# Patient Record
Sex: Female | Born: 1975 | Hispanic: Yes | Marital: Married | Smoking: Never smoker
Health system: Southern US, Community
[De-identification: ages and names within clinical notes are randomized; demographics above are authoritative.]

## PROBLEM LIST (undated history)

## (undated) DIAGNOSIS — E119 Type 2 diabetes mellitus without complications: Secondary | ICD-10-CM

---

## 2015-09-16 NOTE — L&D Delivery Note (Signed)
Operative Delivery Note At  a viable female was delivered via Kiwi vacuum outlet at 0612 on 08/10/16 .  Presentation: vertex; Position: Right,, Occiput,, Anterior; Station: +5/5. Maternal temp at 0354 of 100.3 and 99.8 at 0420. Pushing since 0400.Marland Kitchen. CLE in place    .  Verbal consent: obtained from patient. Via Optician, dispensingportable translator.  Risks and benefits discussed in detail.  Risks include, but are not limited to the risks of anesthesia, bleeding, infection, damage to maternal tissues, fetal cephalhematoma.  There is also the risk of inability to effect vaginal delivery of the head, or shoulder dystocia that cannot be resolved by established maneuvers, leading to the need for emergency cesarean section.  Placement of Vacuum Kiwi bell without difficulty and with first ctx head was delivered . No pop off . Vacuum removed . Thick meconium noted with delivery of shoulders and body . No nuchal cord . Marland Kitchen. Vigorous female placed on mother's abdomen with nursery staff in attendance . Delayed cord clamping . Repair of small second degree laceration without difficulty after the placenta was removed .   APGAR:8/9 , ; weight  .   Placenta status: , . Meconium stained , intact   Cord:3 v   No complications  Anesthesia:  Cle,  Instruments: kiwi bell Episiotomy:  none Lacerations:  second degree Suture Repair: 2.0 3.0 vicryl Est. Blood Loss (mL):  250 cc  Mom to postpartum.  Baby to Couplet care / Skin to Skin.  SCHERMERHORN,THOMAS 08/10/2016, 6:34 AM

## 2016-01-17 LAB — OB RESULTS CONSOLE HGB/HCT, BLOOD
HEMATOCRIT: 36 %
HEMOGLOBIN: 12.7 g/dL

## 2016-01-17 LAB — OB RESULTS CONSOLE HIV ANTIBODY (ROUTINE TESTING): HIV: NONREACTIVE

## 2016-01-18 LAB — OB RESULTS CONSOLE PLATELET COUNT: Platelets: 321 10*3/uL

## 2016-01-18 LAB — OB RESULTS CONSOLE ABO/RH: RH Type: NEGATIVE

## 2016-01-18 LAB — OB RESULTS CONSOLE HEPATITIS B SURFACE ANTIGEN: Hepatitis B Surface Ag: NEGATIVE

## 2016-01-18 LAB — OB RESULTS CONSOLE RPR: RPR: NONREACTIVE

## 2016-01-23 LAB — OB RESULTS CONSOLE GC/CHLAMYDIA
CHLAMYDIA, DNA PROBE: NEGATIVE
GC PROBE AMP, GENITAL: NEGATIVE

## 2016-01-24 ENCOUNTER — Ambulatory Visit: Payer: Self-pay

## 2016-01-31 ENCOUNTER — Ambulatory Visit
Admission: RE | Admit: 2016-01-31 | Discharge: 2016-01-31 | Disposition: A | Discharge status: Home / Self Care | Payer: Medicaid Other | Source: Ambulatory Visit | Attending: Obstetrics and Gynecology | Admitting: Obstetrics and Gynecology

## 2016-01-31 ENCOUNTER — Other Ambulatory Visit: Payer: Self-pay

## 2016-01-31 VITALS — BP 131/83 | HR 107 | Temp 99.2°F | Ht 61.0 in | Wt 129.0 lb

## 2016-01-31 DIAGNOSIS — O09529 Supervision of elderly multigravida, unspecified trimester: Secondary | ICD-10-CM | POA: Insufficient documentation

## 2016-01-31 DIAGNOSIS — Z36 Encounter for antenatal screening of mother: Secondary | ICD-10-CM | POA: Insufficient documentation

## 2016-01-31 DIAGNOSIS — Z3682 Encounter for antenatal screening for nuchal translucency: Secondary | ICD-10-CM

## 2016-01-31 DIAGNOSIS — O09521 Supervision of elderly multigravida, first trimester: Secondary | ICD-10-CM | POA: Insufficient documentation

## 2016-01-31 NOTE — Progress Notes (Signed)
Referring Provider:   [facility] Department Length of Consultation: 45 minutes  [name] was referred to [facility] for genetic counseling because of advanced maternal age.  The patient will be 40 years old at the time of delivery.  This note summarizes the information we discussed.    We explained that the chance of a chromosome abnormality increases with maternal age.  Chromosomes and examples of chromosome problems were reviewed.  Humans typically have 46 chromosomes in each cell, with half passed through each sperm and egg.  Any change in the number or structure of chromosomes can increase the risk of problems in the physical and mental development of a pregnancy.   Based upon age of the patient, the chance of any chromosome abnormality was 1 in 24. The chance of Down syndrome, the most common chromosome problem associated with maternal age, was 1 in 64.  The risk of chromosome problems is in addition to the 3% general population risk for birth defects and mental retardation.  The greatest chance, of course, is that the baby would be born in good health.  We discussed the following prenatal screening and testing options for this pregnancy:  First trimester screening, which includes nuchal translucency ultrasound screen and first trimester maternal serum marker screening.  The nuchal translucency has approximately an 80% detection rate for Down syndrome and can be positive for other chromosome abnormalities as well as heart defects.  When combined with a maternal serum marker screening, the detection rate is up to 90% for Down syndrome and up to 97% for trisomy 18.     The chorionic villus sampling procedure is available for first trimester chromosome analysis.  This involves the withdrawal of a small amount of chorionic villi (tissue from the developing placenta).  Risk of pregnancy loss is estimated to be approximately 1 in 200 to 1 in 100 (0.5 to 1%).  There  is approximately a 1% (1 in 100) chance that the CVS chromosome results will be unclear.  Chorionic villi cannot be tested for neural tube defects.     Maternal serum marker screening, a blood test that measures pregnancy proteins, can provide risk assessments for Down syndrome, trisomy 18, and open neural tube defects (spina bifida, anencephaly). Because it does not directly examine the fetus, it cannot positively diagnose or rule out these problems.  Targeted ultrasound uses high frequency sound waves to create an image of the developing fetus.  An ultrasound is often recommended as a routine means of evaluating the pregnancy.  It is also used to screen for fetal anatomy problems (for example, a heart defect) that might be suggestive of a chromosomal or other abnormality.   Amniocentesis involves the removal of a small amount of amniotic fluid from the sac surrounding the fetus with the use of a thin needle inserted through the maternal abdomen and uterus.  Ultrasound guidance is used throughout the procedure.  Fetal cells from amniotic fluid are directly evaluated and > 99.5% of chromosome problems and > 98% of open neural tube defects can be detected. This procedure is generally performed after the 15th week of pregnancy.  The main risks to this procedure include complications leading to miscarriage in less than 1 in 200 cases (0.5%).  We also reviewed the availability of cell free fetal DNA testing from maternal blood to determine whether or not the baby may have either Down syndrome, trisomy 18, or trisomy 38.  This test utilizes a maternal blood sample  and DNA sequencing technology to isolate circulating cell free fetal DNA from maternal plasma.  The fetal DNA can then be analyzed for DNA sequences that are derived from the three most common chromosomes involved in aneuploidy, chromosomes 13, 18, and 21.  If the overall amount of DNA is greater than the expected level for any of these chromosomes,  aneuploidy is suspected.  While we do not consider it a replacement for invasive testing and karyotype analysis, a negative result from this testing would be reassuring, though not a guarantee of a normal chromosome complement for the baby.  An abnormal result is certainly suggestive of an abnormal chromosome complement, though we would still recommend CVS or amniocentesis to confirm any findings from this testing.  Cystic Fibrosis and Spinal Muscular Atrophy (SMA) screening were also discussed with the patient. Both conditions are recessive, which means that both parents must be carriers in order to have a child with the disease. Cystic fibrosis (CF) is one of the most common genetic conditions in persons of Caucasian ancestry. This condition occurs in approximately 1 in 2,500 Caucasian persons and results in thickened secretions in the lungs, digestive, and reproductive systems. For a baby to be at risk for having CF, both of the parents must be carriers for this condition. Approximately 1 in 5825 Caucasian persons is a carrier for CF. Current carrier testing looks for the most common mutations in the gene for CF and can detect approximately 90% of carriers in the Caucasian population. This means that the carrier screening can greatly reduce, but cannot eliminate, the chance for an individual to have a child with CF. If an individual is found to be a carrier for CF, then carrier testing would be available for the partner. As part of [place]'s newborn screening profile, all babies born in the state of [place] will have a two-tier screening process. Specimens are first tested to determine the concentration of immunoreactive trypsinogen (IRT). The top 5% of specimens with the highest IRT values then undergo DNA testing using a panel of over 40 common CF mutations. SMA is a neurodegenerative disorder that leads to atrophy of skeletal muscle and overall weakness. This condition is also more prevalent in  the Caucasian population, with 1 in 40-1 in 60 persons being a carrier and 1 in 6,000-1 in 10,000 children being affected. There are multiple forms of the disease, with some causing death in infancy to other forms with survival into adulthood. The genetics of SMA is complex, but carrier screening can detect up to 95% of carriers in the Caucasian population. Similar to CF, a negative result can greatly reduce, but cannot eliminate, the chance to have a child with SMA. The patient declined carrier screening for CF and SMA.  We obtained a detailed family history and pregnancy history.   The family history is unremarkable for birth defects, mental retardation, recurrent pregnancy loss or known chromosome abnormalities.  [name] stated that this is her third pregnancy.  She has two children, ages 4417 and 14 years from a prior relationship.  She reported no complications or exposures in this pregnancy that would be expected to increase the risk for birth defects.  After consideration of the options, [name] elected to proceed with cell free fetal DNA testing and to decline CF and SMA carrier screening.  An ultrasound was performed at the time of the visit.  Fetal anatomy could not be assessed due to early gestational age.  Please refer to  the ultrasound report for details of that study.  We scheduled an appointment for the patient to return at approximately 18 weeks for an anatomy ultrasound.  [name] was encouraged to call with questions or concerns.  We can be contacted at [phone].   Cherly Anderson, MS, CGC

## 2016-01-31 NOTE — Progress Notes (Signed)
Karla Wells, MS, CGC performed an integral service incident to the physician's initial service.  I was physically present in the clinical area and was immediately available to render assistance.   Nashay Brickley C Caral Whan  

## 2016-02-07 LAB — INFORMASEQ(SM) WITH XY ANALYSIS
FETAL FRACTION (%): 12.4
Fetal Number: 1
GESTATIONAL AGE AT COLLECTION: 12.7 wk
Weight: 129 [lb_av]

## 2016-02-14 ENCOUNTER — Telehealth: Payer: Self-pay | Admitting: Obstetrics and Gynecology

## 2016-02-14 NOTE — Telephone Encounter (Signed)
The patient was informed of the results of her recent InformaSeq testing (performed at [facility]) which yielded NEGATIVE results with the aid of a Spanish interpreter.  The patient's specimen showed DNA consistent with two copies of chromosomes 21, 18 and 13.  The sensitivity for trisomy 2621, trisomy 1418 and trisomy 7913 using this testing are reported as 99.1%, 98.3% and 98.1% respectively.  Thus, while the results of this testing are highly accurate, they are not considered diagnostic at this time.  Should more definitive information be desired, the patient may still consider amniocentesis.   As requested to know by the patient, sex chromosome analysis was included for this sample.  Results was consistent with a female (XY) fetus. This is predicted with >97% accuracy.  A maternal serum AFP only should be considered if screening for neural tube defects is desired.  Cherly Andersoneborah F. Larhonda Dettloff, MS, CGC

## 2016-03-13 ENCOUNTER — Ambulatory Visit
Admission: RE | Admit: 2016-03-13 | Discharge: 2016-03-13 | Disposition: A | Discharge status: Home / Self Care | Payer: Medicaid Other | Source: Ambulatory Visit | Attending: Obstetrics and Gynecology | Admitting: Obstetrics and Gynecology

## 2016-03-13 VITALS — BP 122/80 | HR 110 | Temp 98.3°F | Resp 18 | Wt 135.6 lb

## 2016-03-13 DIAGNOSIS — O09521 Supervision of elderly multigravida, first trimester: Secondary | ICD-10-CM

## 2016-03-13 DIAGNOSIS — O09522 Supervision of elderly multigravida, second trimester: Secondary | ICD-10-CM | POA: Insufficient documentation

## 2016-03-13 DIAGNOSIS — Z3A18 18 weeks gestation of pregnancy: Secondary | ICD-10-CM | POA: Insufficient documentation

## 2016-05-23 LAB — OB RESULTS CONSOLE HIV ANTIBODY (ROUTINE TESTING): HIV: NONREACTIVE

## 2016-06-03 ENCOUNTER — Encounter: Payer: Medicaid Other | Attending: Advanced Practice Midwife | Admitting: Dietician

## 2016-06-03 ENCOUNTER — Encounter: Payer: Self-pay | Admitting: Dietician

## 2016-06-03 VITALS — BP 124/88 | Ht 60.0 in | Wt 140.5 lb

## 2016-06-03 DIAGNOSIS — O24419 Gestational diabetes mellitus in pregnancy, unspecified control: Secondary | ICD-10-CM | POA: Insufficient documentation

## 2016-06-03 DIAGNOSIS — O24410 Gestational diabetes mellitus in pregnancy, diet controlled: Secondary | ICD-10-CM

## 2016-06-03 DIAGNOSIS — Z3A00 Weeks of gestation of pregnancy not specified: Secondary | ICD-10-CM | POA: Insufficient documentation

## 2016-06-03 NOTE — Progress Notes (Signed)
Appt. Start Time: 0900 Appt. End Time: 1030  GDM Class 1-Otto Afanador interpreted during visit Diabetes Overview - define DM; state own type of DM; identify functions of pancreas and insulin; define insulin deficiency vs insulin resistance  Psychosocial - identify DM as a source of stress; state the effects of stress on BG control; verbalize appropriate stress management techniques; identify personal stress issues   Nutritional Management - describe effects of food on blood glucose; identify sources of carbohydrate, protein and fat; verbalize the importance of balance meals in controlling blood glucose; identify meals as well balanced or not; estimate servings of carbohydrate from menus; use food labels to identify servings size, content of carbohydrate, fiber, protein, fat, saturated fat and sodium; recognize food sources of fat, saturated fat, trans fat, sodium and verbalize goals for intake; describe healthful appropriate food choices when dining out   Exercise - describe the effects of exercise on blood glucose and importance of regular exercise in controlling diabetes; state a plan for personal exercise; verbalize contraindications for exercise  Medications - state name, dose, timing of currently prescribed medications; describe types of medications available for diabetes   Self-Monitoring - state importance of HBGM and demo procedure accurately; use HBGM results to effectively manage diabetes; identify importance of regular HbA1C testing and goals for results  Acute Complications/Sick Day Guidelines - recognize hyperglycemia and hypoglycemia with causes and effects; identify blood glucose results as high, low or in control; list steps in treating and preventing high and low blood glucose; state appropriate measure to manage blood glucose when ill (need for meds, HBGM plan, when to call physician, need for fluids)   Lifestyle Changes/Goals & Health/Community Resources - state benefits of  making appropriate lifestyle changes; identify habits that need to change (meals, tobacco, alcohol); identify strategies to reduce risk factors for personal health; set goals for proper diabetes care; state need for and frequency of healthcare follow-up; describe appropriate community resources for good health (ADA, web sites, apps)   Pregnancy/Sexual Health - define gestational diabetes; state importance of good blood glucose control and birth control prior to pregnancy; state importance of good blood glucose control in preventing sexual problems (impotence, vaginal dryness, infections, loss of desire); state relationship of blood glucose control and pregnancy outcome; describe risk of maternal and fetal complications  Teaching Materials Used: Meter-True Result meter General Meal Planning Guidelines Daily Food Record Gestational Diabetes Booklet Gestational Video Goals for Healthy Pregnancy

## 2016-06-03 NOTE — Patient Instructions (Addendum)
Read booklet on Gestational Diabetes Follow Gestational Meal Planning Guidelines Avoid fruit juices-continue to drink plenty of water Continue to avoid fried foods and sweets Eat 3 meals/day and 3 snacks as instructed Complete a 3 Day Food Record and bring to next appointment Check blood sugars 4 x day - before breakfast and 2 hrs after every meal and record  Bring blood sugar log to all appointments Walk 30 minutes at least 5 x week if permitted by MD Next appointment   06-12-16 at 1:15p

## 2016-06-12 ENCOUNTER — Encounter: Payer: Medicaid Other | Admitting: Dietician

## 2016-06-12 ENCOUNTER — Encounter: Payer: Self-pay | Admitting: Dietician

## 2016-06-12 VITALS — BP 100/70 | Ht 60.0 in | Wt 141.1 lb

## 2016-06-12 DIAGNOSIS — O24410 Gestational diabetes mellitus in pregnancy, diet controlled: Secondary | ICD-10-CM

## 2016-06-12 NOTE — Patient Instructions (Signed)
   Eat 2-3 servings of carbohydrate foods (carbohidratos) with each meal.   Include a protein food with your evening snack.

## 2016-06-12 NOTE — Progress Notes (Signed)
   Patient's BG record indicates BGs are within goal ranges, except some low post-meal readings initially. She feels the TrueResult meter she was using was giving inaccurate low readings, as she did not have any symptoms even when having a result of 49. She has switched to a different meter, and has not had low readings since. Tested meter with level 1 control (only level available), which tested within range. Advised patient to contact customer service by phone to discuss problem. Informed patient of 10% accuracy range in meters.   Patient's food diary indicates most meals below recommended level of carbohydrate. She is including protein sources with most meals.   Provided 1900kcal meal plan, and wrote individualized menus based on patient's food preferences. Advised adequate carbohydrate intake and protein source with every meal as well as snacks.   Instructed patient on food safety, including avoidance of Listeriosis, and limiting mercury from fish.  Discussed importance of maintaining healthy lifestyle habits to reduce risk of Type 2 DM as well as Gestational DM with any future pregnancies.  Advised patient to use any remaining testing supplies to test some BGs after delivery, and to have BG tested ideally annually, as well as prior to attempting future pregnancies.

## 2016-07-13 LAB — OB RESULTS CONSOLE GBS: STREP GROUP B AG: NEGATIVE

## 2016-07-15 LAB — OB RESULTS CONSOLE GC/CHLAMYDIA
CHLAMYDIA, DNA PROBE: NEGATIVE
GC PROBE AMP, GENITAL: NEGATIVE

## 2016-08-05 ENCOUNTER — Observation Stay
Admission: EM | Admit: 2016-08-05 | Discharge: 2016-08-05 | Disposition: A | Discharge status: Home / Self Care | Payer: Self-pay | Attending: Obstetrics and Gynecology | Admitting: Obstetrics and Gynecology

## 2016-08-05 DIAGNOSIS — Z3A39 39 weeks gestation of pregnancy: Secondary | ICD-10-CM | POA: Insufficient documentation

## 2016-08-05 DIAGNOSIS — O139 Gestational [pregnancy-induced] hypertension without significant proteinuria, unspecified trimester: Secondary | ICD-10-CM | POA: Diagnosis present

## 2016-08-05 DIAGNOSIS — O09522 Supervision of elderly multigravida, second trimester: Secondary | ICD-10-CM

## 2016-08-05 DIAGNOSIS — O133 Gestational [pregnancy-induced] hypertension without significant proteinuria, third trimester: Principal | ICD-10-CM | POA: Insufficient documentation

## 2016-08-05 LAB — COMPREHENSIVE METABOLIC PANEL
ALT: 25 U/L (ref 14–54)
ANION GAP: 10 (ref 5–15)
AST: 26 U/L (ref 15–41)
Albumin: 2.9 g/dL — ABNORMAL LOW (ref 3.5–5.0)
Alkaline Phosphatase: 207 U/L — ABNORMAL HIGH (ref 38–126)
BUN: 11 mg/dL (ref 6–20)
CHLORIDE: 110 mmol/L (ref 101–111)
CO2: 17 mmol/L — ABNORMAL LOW (ref 22–32)
Calcium: 8.9 mg/dL (ref 8.9–10.3)
Creatinine, Ser: 0.53 mg/dL (ref 0.44–1.00)
GFR calc Af Amer: >60 mL/min (ref >60)
Glucose, Bld: 90 mg/dL (ref 65–99)
POTASSIUM: 3.9 mmol/L (ref 3.5–5.1)
Sodium: 137 mmol/L (ref 135–145)
Total Bilirubin: 0.6 mg/dL (ref 0.3–1.2)
Total Protein: 6.5 g/dL (ref 6.5–8.1)

## 2016-08-05 LAB — CBC
HCT: 39.3 % (ref 35.0–47.0)
Hemoglobin: 13.6 g/dL (ref 12.0–16.0)
MCH: 32.5 pg (ref 26.0–34.0)
MCHC: 34.5 g/dL (ref 32.0–36.0)
MCV: 94.2 fL (ref 80.0–100.0)
PLATELETS: 171 10*3/uL (ref 150–440)
RBC: 4.17 MIL/uL (ref 3.80–5.20)
RDW: 14.3 % (ref 11.5–14.5)
WBC: 11.4 10*3/uL — AB (ref 3.6–11.0)

## 2016-08-05 LAB — PROTEIN / CREATININE RATIO, URINE: CREATININE, URINE: 17 mg/dL

## 2016-08-05 NOTE — Progress Notes (Signed)
Patient ID: Karla Powers, female   DOB: 10/31/1975, 40 y.o.   MRN: [id number] Karla Powers November 13, 1975 G3 P2 65w3dpresents for 2evaluation from the [facility] for elevation bp in office  noLOF , no vaginal bleeding ,itial bp 152/92   , later bp 1116/83. 128/88   No vision change , no h/a , no scotomata O;BP 128/88   Pulse 87   Temp 98.2 F (36.8 C) (Oral)   Resp 18   LMP 11/03/2015  ABDsoft NT  CX TFT / 60 / -3 vtx NSTfhr 130 with prolonged accels , reactive , no decels  Labs: neg Protein / cr ration . CBC and met c nl  A: gestational HTN , no evidence of preeclampsia based on labs  P:d/c home with rest  rtc 08/08/16 for BP recheck  Precaution to RTC before then , translator present .

## 2016-08-05 NOTE — OB Triage Note (Signed)
[name] here for Preeclampsia evaluation following elevated BP at [facility]

## 2016-08-05 NOTE — Discharge Summary (Signed)
  Boykin Nearing, MD  Obstetrics    '[]'$ Hide copied text '[]'$ Hover for attribution information Patient ID: Karla Powers, female   DOB: 05-12-1976, 40 y.o.   MRN: [id number] Nianna Igo [birth year] G3 P2 7w3dpresents for 2evaluation from the [facility] for elevation bp in office  noLOF , no vaginal bleeding ,itial bp 152/92   , later bp 1116/83. 128/88   No vision change , no h/a , no scotomata O;BP 128/88   Pulse 87   Temp 98.2 F (36.8 C) (Oral)   Resp 18   LMP 11/03/2015  ABDsoft NT  CX TFT / 60 / -3 vtx NSTfhr 130 with prolonged accels , reactive , no decels  Labs: neg Protein / cr ration . CBC and met c nl  A: gestational HTN , no evidence of preeclampsia based on labs  P:d/c home with rest  rtc 08/08/16 for BP recheck  Precaution to RTC before then , translator present .     Electronically signed by TBoykin Nearing MD at 08/05/2016 10:50 PM      ED to Hosp-Admission (Current) on 08/05/2016        Detailed Report

## 2016-08-05 NOTE — Discharge Instructions (Signed)
Discharge instructions given, all questions answered.

## 2016-08-08 ENCOUNTER — Encounter: Payer: Self-pay | Admitting: *Deleted

## 2016-08-08 ENCOUNTER — Inpatient Hospital Stay
Admission: RE | Admit: 2016-08-08 | Discharge: 2016-08-11 | DRG: 775 | Disposition: A | Discharge status: Home / Self Care | Payer: Medicaid Other | Source: Ambulatory Visit | Attending: Obstetrics & Gynecology | Admitting: Obstetrics & Gynecology

## 2016-08-08 DIAGNOSIS — O24420 Gestational diabetes mellitus in childbirth, diet controlled: Secondary | ICD-10-CM | POA: Diagnosis present

## 2016-08-08 DIAGNOSIS — O134 Gestational [pregnancy-induced] hypertension without significant proteinuria, complicating childbirth: Secondary | ICD-10-CM | POA: Diagnosis present

## 2016-08-08 DIAGNOSIS — O09523 Supervision of elderly multigravida, third trimester: Secondary | ICD-10-CM

## 2016-08-08 DIAGNOSIS — R030 Elevated blood-pressure reading, without diagnosis of hypertension: Secondary | ICD-10-CM | POA: Diagnosis present

## 2016-08-08 DIAGNOSIS — Z3A39 39 weeks gestation of pregnancy: Secondary | ICD-10-CM

## 2016-08-08 DIAGNOSIS — O163 Unspecified maternal hypertension, third trimester: Secondary | ICD-10-CM | POA: Diagnosis present

## 2016-08-08 HISTORY — DX: Type 2 diabetes mellitus without complications: E11.9

## 2016-08-08 LAB — COMPREHENSIVE METABOLIC PANEL
ALBUMIN: 2.9 g/dL — AB (ref 3.5–5.0)
ALT: 28 U/L (ref 14–54)
AST: 30 U/L (ref 15–41)
Alkaline Phosphatase: 194 U/L — ABNORMAL HIGH (ref 38–126)
Anion gap: 10 (ref 5–15)
BUN: 9 mg/dL (ref 6–20)
CHLORIDE: 106 mmol/L (ref 101–111)
CO2: 19 mmol/L — AB (ref 22–32)
CREATININE: 0.57 mg/dL (ref 0.44–1.00)
Calcium: 8.9 mg/dL (ref 8.9–10.3)
GFR calc Af Amer: >60 mL/min (ref >60)
GLUCOSE: 70 mg/dL (ref 65–99)
Potassium: 3.7 mmol/L (ref 3.5–5.1)
SODIUM: 135 mmol/L (ref 135–145)
Total Bilirubin: 0.7 mg/dL (ref 0.3–1.2)
Total Protein: 6.6 g/dL (ref 6.5–8.1)

## 2016-08-08 LAB — CBC
HEMATOCRIT: 38.9 % (ref 35.0–47.0)
HEMOGLOBIN: 13.5 g/dL (ref 12.0–16.0)
MCH: 32.3 pg (ref 26.0–34.0)
MCHC: 34.8 g/dL (ref 32.0–36.0)
MCV: 92.7 fL (ref 80.0–100.0)
Platelets: 180 10*3/uL (ref 150–440)
RBC: 4.2 MIL/uL (ref 3.80–5.20)
RDW: 14.3 % (ref 11.5–14.5)
WBC: 11.1 10*3/uL — AB (ref 3.6–11.0)

## 2016-08-08 LAB — PROTEIN / CREATININE RATIO, URINE
Creatinine, Urine: 37 mg/dL
Total Protein, Urine: <6 mg/dL

## 2016-08-08 LAB — TYPE AND SCREEN
ABO/RH(D): O POS
ANTIBODY SCREEN: NEGATIVE

## 2016-08-08 LAB — GLUCOSE, CAPILLARY: GLUCOSE-CAPILLARY: 70 mg/dL (ref 65–99)

## 2016-08-08 MED ORDER — ACETAMINOPHEN 325 MG PO TABS
650.0000 mg | ORAL_TABLET | ORAL | Status: DC | PRN
Start: 2016-08-08 — End: 2016-08-10

## 2016-08-08 MED ORDER — OXYCODONE-ACETAMINOPHEN 5-325 MG PO TABS: 1.0000 | ORAL_TABLET | ORAL | Status: DC | PRN

## 2016-08-08 MED ORDER — OXYTOCIN 40 UNITS IN LACTATED RINGERS INFUSION - SIMPLE MED: 2.5000 [IU]/h | INTRAVENOUS | Status: DC

## 2016-08-08 MED ORDER — SOD CITRATE-CITRIC ACID 500-334 MG/5ML PO SOLN: 30.0000 mL | ORAL | Status: DC | PRN

## 2016-08-08 MED ORDER — TERBUTALINE SULFATE 1 MG/ML IJ SOLN
0.2500 mg | Freq: Once | INTRAMUSCULAR | Status: DC | PRN
  Filled 2016-08-08: qty 1

## 2016-08-08 MED ORDER — OXYTOCIN BOLUS FROM INFUSION
500.0000 mL | Freq: Once | INTRAVENOUS | Status: AC
  Administered 2016-08-10: 500 mL via INTRAVENOUS

## 2016-08-08 MED ORDER — OXYCODONE-ACETAMINOPHEN 5-325 MG PO TABS: 2.0000 | ORAL_TABLET | ORAL | Status: DC | PRN

## 2016-08-08 MED ORDER — LIDOCAINE HCL (PF) 1 % IJ SOLN
30.0000 mL | INTRAMUSCULAR | Status: DC | PRN
  Administered 2016-08-10: 10 mL via SUBCUTANEOUS

## 2016-08-08 MED ORDER — BUTORPHANOL TARTRATE 1 MG/ML IJ SOLN
1.0000 mg | INTRAMUSCULAR | Status: DC | PRN
Start: 2016-08-08 — End: 2016-08-09

## 2016-08-08 MED ORDER — LACTATED RINGERS IV SOLN
INTRAVENOUS | Status: DC
  Administered 2016-08-08: 21:00:00 via INTRAVENOUS
  Administered 2016-08-08: 1000 mL via INTRAVENOUS
  Administered 2016-08-09 (×3): via INTRAVENOUS

## 2016-08-08 MED ORDER — AMMONIA AROMATIC IN INHA
RESPIRATORY_TRACT | Status: AC
  Filled 2016-08-08: qty 10

## 2016-08-08 MED ORDER — MISOPROSTOL 25 MCG QUARTER TABLET
25.0000 ug | ORAL_TABLET | ORAL | Status: DC | PRN
  Administered 2016-08-08: 25 ug via VAGINAL
  Filled 2016-08-08: qty 1

## 2016-08-08 MED ORDER — OXYTOCIN 40 UNITS IN LACTATED RINGERS INFUSION - SIMPLE MED: 1.0000 m[IU]/min | INTRAVENOUS | Status: DC

## 2016-08-08 MED ORDER — OXYTOCIN 10 UNIT/ML IJ SOLN
INTRAMUSCULAR | Status: AC
  Filled 2016-08-08: qty 2

## 2016-08-08 MED ORDER — MISOPROSTOL 200 MCG PO TABS
ORAL_TABLET | ORAL | Status: AC
  Filled 2016-08-08: qty 4

## 2016-08-08 MED ORDER — OXYTOCIN 40 UNITS IN LACTATED RINGERS INFUSION - SIMPLE MED
INTRAVENOUS | Status: AC
  Administered 2016-08-09: 1 m[IU]/min via INTRAVENOUS
  Filled 2016-08-08: qty 1000

## 2016-08-08 MED ORDER — LACTATED RINGERS IV SOLN
500.0000 mL | INTRAVENOUS | Status: DC | PRN
  Administered 2016-08-09 (×2): 500 mL via INTRAVENOUS

## 2016-08-08 MED ORDER — LIDOCAINE HCL (PF) 1 % IJ SOLN
INTRAMUSCULAR | Status: AC
  Administered 2016-08-10: 10 mL via SUBCUTANEOUS
  Filled 2016-08-08: qty 30

## 2016-08-08 MED ORDER — MISOPROSTOL 25 MCG QUARTER TABLET
25.0000 ug | ORAL_TABLET | ORAL | Status: DC
  Administered 2016-08-08 – 2016-08-09 (×3): 25 ug via ORAL
  Filled 2016-08-08: qty 0.25
  Filled 2016-08-08: qty 1
  Filled 2016-08-08: qty 0.25

## 2016-08-08 MED ORDER — ONDANSETRON HCL 4 MG/2ML IJ SOLN: 4.0000 mg | Freq: Four times a day (QID) | INTRAMUSCULAR | Status: DC | PRN

## 2016-08-08 NOTE — H&P (Signed)
OB History & Physical   History of Present Illness:  Chief Complaint:   HPI:  Karla Powers is a 40 y.o. G3P2 female at 6067w6d dated by LMP c/w 13wk US with edc of 08/09/16.  She presents to L&D for BP recheck due to elevated pressures on 11/21, and need for monitoring.  +FM, no CTX, no LOF, no VB Denies: HA, visual changes, SOB, or RUQ/epigastric pain   Pregnancy Issues: 1. Gestational diabetes A1 2. Gestational hypertension, dx'd today  Spanish speaking only, needs interpreter.  Maternal Medical History:   Past Medical History:  Diagnosis Date  . Diabetes mellitus without complication (HCC)    with this pregnancy, diet control only    History reviewed. No pertinent surgical history.  No Known Allergies  Prior to Admission medications   Medication Sig Start Date End Date Taking? Authorizing Provider  Prenatal Vit-Fe Fumarate-FA (MULTIVITAMIN-PRENATAL) 27-0.8 MG TABS tablet Take 1 tablet by mouth daily at 12 noon.   Yes Historical Provider, MD     Pren[facility] care site[facility]   Social History: She  reports that she has never smoked. She has never used smokeless tobacco. She reports that she does not drink alcohol or use drugs.  Family History: family history is not on file.   Review of Systems: A full review of systems was performed and negative except as noted in the HPI.     Physical Exam:  Vital Signs: BP 132/89   Pulse 73   Temp 98 F (36.7 C) (Oral)   Resp 20   Ht 5\' 1"  (1.549 m)   Wt 66.7 kg (147 lb)   LMP 11/03/2015   BMI 27.78 kg/m  General: no acute distress.  HEENT: normocephalic, atraumatic Heart: regular rate & rhythm.  No murmurs/rubs/gallops Lungs: clear to auscultation bilaterally, normal respiratory effort Abdomen: soft, gravid, non-tender;  EFW: 8.2 Pelvic:   External: Normal external female genitalia  Cervix: Dilation: Fingertip / Effacement (%): 50 / Station: Ballotable    Extremities: non-tender,  symmetric, 1+ edema bilaterally.  DTRs: 3+ Neurologic: Alert & oriented x 3.    No results found for this or any previous visit (from the past 24 hour(s)).  Pertinent Results:  Prenatal Labs: Blood type/Rh O+  Antibody screen neg  Rubella Immune  Varicella Immune  RPR NR  HBsAg Neg  HIV NR  GC neg  Chlamydia neg  Genetic screening negative  1 hour GTT 159  3 hour GTT 91/ 188 / 166 / 140   GBS negative   FHT:  145 mod + accels no decels TOCO: irregular SVE:  Dilation: Fingertip / Effacement (%): 50 / Station: Ballotable    Cephalic by [employer]  Assessment:  Karla Powers is a 40 y.o. G3P2 female at 2067w6d with IOL for GHTN.   Plan:  1. Admit to Labor & Delivery 2. CBC, T&S, Clrs, IVF 3. GBS  neg 4. Consents obtained. 5. Continuous efm/toco 6. IUP: category 1 7. IOL:  Bishop <6, will start with buccal cytotec.  Will consider adding Foley Bulb once further dilated.   8. GDM: check BS on admission, if elevated, will need consistent monitoring throughout labor.  If not, will check intermittently PRN.  ----- Ranae Plumberhelsea Delshawn Stech, MD Attending Obstetrician and Gynecologist [facility], Department of OB/GYN [facility]

## 2016-08-08 NOTE — Progress Notes (Signed)
Pt transferred to LDR # 3  by A Martie RoundMilner RN, accompanied by daughter and Celine Ahrunt and belongings

## 2016-08-08 NOTE — OB Triage Note (Addendum)
Pt states she was seen in [facility] on Tues. BP noted to elevated, [facility] Seen by Dr schermerhorn, and evaluated. Pt states she had P checked and labs and urine sent and was discharged home. Pt states DR instructed her to return today for BP recheck as next HD appointment on the 29th too far out.Triage admission aided by [facility] tranlator, Myriam JacobsonHelen. Pt accompanied by 40 year old daughter and her mother, all Spanish speaking

## 2016-08-09 ENCOUNTER — Inpatient Hospital Stay: Payer: Medicaid Other | Admitting: Anesthesiology

## 2016-08-09 LAB — GLUCOSE, CAPILLARY: GLUCOSE-CAPILLARY: 70 mg/dL (ref 65–99)

## 2016-08-09 LAB — RPR: RPR: NONREACTIVE

## 2016-08-09 MED ORDER — BUTORPHANOL TARTRATE 1 MG/ML IJ SOLN
2.0000 mg | INTRAMUSCULAR | Status: DC | PRN
Start: 2016-08-09 — End: 2016-08-10

## 2016-08-09 MED ORDER — SODIUM CHLORIDE FLUSH 0.9 % IV SOLN
INTRAVENOUS | Status: AC
  Filled 2016-08-09: qty 10

## 2016-08-09 MED ORDER — FENTANYL 2.5 MCG/ML W/ROPIVACAINE 0.2% IN NS 100 ML EPIDURAL INFUSION (ARMC-ANES)
EPIDURAL | Status: DC | PRN
  Administered 2016-08-09: 10 mL/h via EPIDURAL
  Administered 2016-08-10: 250 ug via EPIDURAL

## 2016-08-09 MED ORDER — FENTANYL 2.5 MCG/ML W/ROPIVACAINE 0.2% IN NS 100 ML EPIDURAL INFUSION (ARMC-ANES)
EPIDURAL | Status: AC
  Filled 2016-08-09: qty 100

## 2016-08-09 MED ORDER — OXYTOCIN 40 UNITS IN LACTATED RINGERS INFUSION - SIMPLE MED
1.0000 m[IU]/min | INTRAVENOUS | Status: DC
  Administered 2016-08-09: 1 m[IU]/min via INTRAVENOUS
  Administered 2016-08-09: 7 m[IU]/min via INTRAVENOUS

## 2016-08-09 MED ORDER — SODIUM CHLORIDE 0.9 % IV SOLN
INTRAVENOUS | Status: DC | PRN
  Administered 2016-08-09 (×3): 5 mL via EPIDURAL

## 2016-08-09 MED ORDER — TERBUTALINE SULFATE 1 MG/ML IJ SOLN
0.2500 mg | Freq: Once | INTRAMUSCULAR | Status: AC | PRN
  Administered 2016-08-09: 0.25 mg via SUBCUTANEOUS

## 2016-08-09 MED ORDER — LIDOCAINE-EPINEPHRINE (PF) 1.5 %-1:200000 IJ SOLN
INTRAMUSCULAR | Status: DC | PRN
  Administered 2016-08-09: 3 mL

## 2016-08-09 NOTE — Anesthesia Preprocedure Evaluation (Signed)
Anesthesia Evaluation  Patient identified by MRN, date of birth, ID band Patient awake    Reviewed: Allergy & Precautions, NPO status , Patient's Chart, lab work & pertinent test results  History of Anesthesia Complications Negative for: history of anesthetic complications  Airway Mallampati: II  TM Distance: >3 FB Neck ROM: Full    Dental no notable dental hx.    Pulmonary neg pulmonary ROS, neg sleep apnea, neg COPD,    breath sounds clear to auscultation- rhonchi (-) wheezing      Cardiovascular hypertension (gHTN), (-) CAD and (-) Past MI  Rhythm:Regular Rate:Normal - Systolic murmurs, - Diastolic murmurs and - Carotid Bruit    Neuro/Psych negative neurological ROS  negative psych ROS   GI/Hepatic negative GI ROS, Neg liver ROS,   Endo/Other  diabetes (gDM)  Renal/GU negative Renal ROS     Musculoskeletal negative musculoskeletal ROS (+)   Abdominal Gravid abdomen  Peds  Hematology negative hematology ROS (+)   Anesthesia Other Findings   Reproductive/Obstetrics (+) Pregnancy                             Anesthesia Physical Anesthesia Plan  ASA: II  Anesthesia Plan: Epidural   Post-op Pain Management:    Induction:   Airway Management Planned:   Additional Equipment:   Intra-op Plan:   Post-operative Plan:   Informed Consent: I have reviewed the patients History and Physical, chart, labs and discussed the procedure including the risks, benefits and alternatives for the proposed anesthesia with the patient or authorized representative who has indicated his/her understanding and acceptance.     Plan Discussed with: Anesthesiologist  Anesthesia Plan Comments:         Lab Results  Component Value Date   WBC 11.1 (H) 08/08/2016   HGB 13.5 08/08/2016   HCT 38.9 08/08/2016   MCV 92.7 08/08/2016   PLT 180 08/08/2016    Anesthesia Quick Evaluation

## 2016-08-09 NOTE — Progress Notes (Signed)
Patient ID: Karla Powers, female   DOB: [birth year], 40 y.o.   MRN: [id number] arom , clear  IUPC placed

## 2016-08-09 NOTE — Anesthesia Procedure Notes (Signed)
Epidural Patient location during procedure: OB Start time: 08/09/2016 6:30 PM End time: 08/09/2016 6:49 PM  Staffing Anesthesiologist: Karla Powers, Karla Powers Performed: anesthesiologist   Preanesthetic Checklist Completed: patient identified, site marked, surgical consent, pre-op evaluation, timeout performed, IV checked, risks and benefits discussed and monitors and equipment checked  Epidural Patient position: sitting Prep: ChloraPrep Patient monitoring: heart rate, continuous pulse ox and blood pressure Approach: midline Location: L4-L5 Injection technique: LOR saline  Needle:  Needle type: Tuohy  Needle gauge: 18 G Needle length: 9 cm and 9 Needle insertion depth: 5.5 cm Catheter type: closed end flexible Catheter size: 20 Guage Catheter at skin depth: 9.5 cm Test dose: negative (0.125% bupivacaine)  Assessment Events: blood not aspirated, injection not painful, no injection resistance, negative IV test and no paresthesia  Additional Notes   Patient tolerated the insertion well without complications.Reason for block:procedure for pain

## 2016-08-09 NOTE — Progress Notes (Signed)
Steva ColderMiriam L Osorio Lainez is a 40 y.o. G3P2 at 5465w0d currently on 9 mu/min  Subjective:   Objective: BP 118/81   Pulse 80   Temp 98.7 F (37.1 C) (Oral)   Resp 16   Ht 5\' 1"  (1.549 m)   Wt 147 lb (66.7 kg)   LMP 11/03/2015   BMI 27.78 kg/m  I/O last 3 completed shifts: In: 2175 [I.V.:2175] Out: -  Total I/O In: 67.5 [I.V.:67.5] Out: -   FHT:  FHR: 140 bpm, variability: minimal ,  accelerations:  Present,  decelerations:  Absent UC:   regular, every 3 minutes SVE:   Dilation: 3.5 Effacement (%): 80 Station: -2 Exam by:: dr schermerhorn  Labs: Lab Results  Component Value Date   WBC 11.1 (H) 08/08/2016   HGB 13.5 08/08/2016   HCT 38.9 08/08/2016   MCV 92.7 08/08/2016   PLT 180 08/08/2016    Assessment / Plan:  Plan on  AROM  And place IUPC and cont Pitocin  SCHERMERHORN,THOMAS 08/09/2016, 5:51 PM

## 2016-08-09 NOTE — Progress Notes (Signed)
Patient ID: Karla Powers, female   DOB: Jan 21, 1976, 40 y.o.   MRN: [id number] 7 min spontaneous decel responded to Pit off , O2 and IVF + sq terb .  Possible hyperstimulation  Vs relative hypotension post the CLE  BP 98/ 60  Cont observation  Explained the issues via the translator

## 2016-08-09 NOTE — Progress Notes (Signed)
Karla Powers is a 40 y.o. G3P2 at 5279w0d induction day 2 for Gestational HTN . cytotec   Oral 25 mcg q 4 hrs last at 0500. Irregular ctx  Objective: BP 124/72 (BP Location: Right Arm)   Pulse 70   Temp 98.1 F (36.7 C) (Oral)   Resp 16   Ht 5\' 1"  (1.549 m)   Wt 147 lb (66.7 kg)   LMP 11/03/2015   BMI 27.78 kg/m  I/O last 3 completed shifts: In: 2175 [I.V.:2175] Out: -  No intake/output data recorded.  FHT:  FHR: 150 bpm, variability: moderate,  accelerations:  Present,  decelerations:  Absent UC:   irregular, every 4-6 minutes SVE:   Dilation: Fingertip Effacement (%): 50 Station: Ballotable Exam by:: Orie FishermanM Taylor RN Exam at [phone] TJS 2 cm / 80 /0  vtx Labs: Lab Results  Component Value Date   WBC 11.1 (H) 08/08/2016   HGB 13.5 08/08/2016   HCT 38.9 08/08/2016   MCV 92.7 08/08/2016   PLT 180 08/08/2016    Assessment / Plan:inductional of labor  Induction of labor for gestational HTN  BP stable  Will start pitocin at noon  Stadol / cle prn pain    Adaliah Hiegel 08/09/2016, 8:53 AM

## 2016-08-09 NOTE — Progress Notes (Signed)
Karla Powers is a 40 y.o. G3P2 at 6456w0d Subjective: Comfortable   a few episodes of fetal decels that prompted the pitocin to be turned off.  Objective: BP 122/79   Pulse (!) 104   Temp 98.7 F (37.1 C) (Oral)   Resp 16   Ht 5\' 1"  (1.549 m)   Wt 147 lb (66.7 kg)   LMP 11/03/2015   SpO2 100%   BMI 27.78 kg/m  I/O last 3 completed shifts: In: 4102.5 [P.O.:360; I.V.:3742.5] Out: -  Total I/O In: 621.5 [P.O.:120; I.V.:501.5] Out: -   FHT:  FHR: 150 bpm, variability: moderate,  accelerations:  Present,  decelerations:  Absent UC:   regular, every 6-7 minutes SVE:   5.5 cm / c/ -2  Labs: Lab Results  Component Value Date   WBC 11.1 (H) 08/08/2016   HGB 13.5 08/08/2016   HCT 38.9 08/08/2016   MCV 92.7 08/08/2016   PLT 180 08/08/2016    Assessment / Plan: Protracted active phase with inadequate ctx pattern  Restart pitocin slowly  Cont to monitor Karla Powers 08/09/2016, 11:31 PM

## 2016-08-10 MED ORDER — COCONUT OIL OIL
1.0000 "application " | TOPICAL_OIL | Status: DC | PRN
  Filled 2016-08-10: qty 120

## 2016-08-10 MED ORDER — MAGNESIUM HYDROXIDE 400 MG/5ML PO SUSP: 30.0000 mL | ORAL | Status: DC | PRN

## 2016-08-10 MED ORDER — SIMETHICONE 80 MG PO CHEW: 80.0000 mg | CHEWABLE_TABLET | ORAL | Status: DC | PRN

## 2016-08-10 MED ORDER — DIBUCAINE 1 % RE OINT: 1.0000 "application " | TOPICAL_OINTMENT | RECTAL | Status: DC | PRN

## 2016-08-10 MED ORDER — FENTANYL 2.5 MCG/ML W/ROPIVACAINE 0.2% IN NS 100 ML EPIDURAL INFUSION (ARMC-ANES)
EPIDURAL | Status: AC
  Filled 2016-08-10: qty 100

## 2016-08-10 MED ORDER — WITCH HAZEL-GLYCERIN EX PADS: 1.0000 "application " | MEDICATED_PAD | CUTANEOUS | Status: DC | PRN

## 2016-08-10 MED ORDER — MEASLES, MUMPS & RUBELLA VAC ~~LOC~~ INJ
0.5000 mL | INJECTION | Freq: Once | SUBCUTANEOUS | Status: DC
  Filled 2016-08-10: qty 0.5

## 2016-08-10 MED ORDER — PRENATAL MULTIVITAMIN CH
1.0000 | ORAL_TABLET | Freq: Every day | ORAL | Status: DC
  Administered 2016-08-10 – 2016-08-11 (×2): 1 via ORAL
  Filled 2016-08-10 (×2): qty 1

## 2016-08-10 MED ORDER — ONDANSETRON HCL 4 MG PO TABS: 4.0000 mg | ORAL_TABLET | ORAL | Status: DC | PRN

## 2016-08-10 MED ORDER — DIPHENHYDRAMINE HCL 25 MG PO CAPS: 25.0000 mg | ORAL_CAPSULE | Freq: Four times a day (QID) | ORAL | Status: DC | PRN

## 2016-08-10 MED ORDER — ZOLPIDEM TARTRATE 5 MG PO TABS: 5.0000 mg | ORAL_TABLET | Freq: Every evening | ORAL | Status: DC | PRN

## 2016-08-10 MED ORDER — ONDANSETRON HCL 4 MG/2ML IJ SOLN: 4.0000 mg | INTRAMUSCULAR | Status: DC | PRN

## 2016-08-10 MED ORDER — ACETAMINOPHEN 325 MG PO TABS: 650.0000 mg | ORAL_TABLET | ORAL | Status: DC | PRN

## 2016-08-10 MED ORDER — SENNOSIDES-DOCUSATE SODIUM 8.6-50 MG PO TABS: 2.0000 | ORAL_TABLET | ORAL | Status: DC

## 2016-08-10 MED ORDER — BENZOCAINE-MENTHOL 20-0.5 % EX AERO: 1.0000 "application " | INHALATION_SPRAY | CUTANEOUS | Status: DC | PRN

## 2016-08-10 MED ORDER — IBUPROFEN 600 MG PO TABS
600.0000 mg | ORAL_TABLET | Freq: Four times a day (QID) | ORAL | Status: DC
  Administered 2016-08-10 – 2016-08-11 (×5): 600 mg via ORAL
  Filled 2016-08-10 (×5): qty 1

## 2016-08-10 MED ORDER — HYDROCODONE-ACETAMINOPHEN 5-325 MG PO TABS
1.0000 | ORAL_TABLET | ORAL | Status: DC | PRN
  Administered 2016-08-10: 1 via ORAL
  Filled 2016-08-10: qty 1

## 2016-08-10 MED ORDER — FERROUS SULFATE 325 (65 FE) MG PO TABS
325.0000 mg | ORAL_TABLET | Freq: Two times a day (BID) | ORAL | Status: DC
  Administered 2016-08-10 – 2016-08-11 (×2): 325 mg via ORAL
  Filled 2016-08-10 (×2): qty 1

## 2016-08-10 NOTE — Plan of Care (Signed)
Problem: Education: Goal: Knowledge of condition will improve Outcome: Progressing Careplan Instruction/Education via Hosp. Interpreter [employer]

## 2016-08-10 NOTE — Discharge Summary (Signed)
Obstetric Discharge Summary   Reason for Admission: induction of labor and gest HTN A1GDM Prenatal Procedures: none Intrapartum Procedures: vacuum outlet KIWI Postpartum Procedures: none, oral FE replacement  Complications-Operative and Postpartum: none and 2 degree perineal laceration, Meconium staining Hemoglobin  Date Value Ref Range Status  08/08/2016 13.5 12.0 - 16.0 g/dL Final  16/10/960405/12/2015 54.012.7 g/dL Final   HCT  Date Value Ref Range Status  08/08/2016 38.9 35.0 - 47.0 % Final  01/17/2016 36 % Final    Physical Exam:  General: alert and cooperative Lochia: appropriate Uterine Fundus: firm Incision: healing well DVT Evaluation: No evidence of DVT seen on physical exam.  Discharge Diagnoses: Term Pregnancy-delivered Blood type/Rh O+  Antibody screen neg  Rubella Immune  Varicella Immune  RPR NR  HBsAg Neg  HIV NR  GC neg  Chlamydia neg  Genetic screening negative  1 hour GTT 159  3 hour GTT 91/ 188 / 166 / 140   GBS negative   Results for orders placed or performed during the hospital encounter of 08/08/16 (from the past 24 hour(s))  CBC     Status: Abnormal   Collection Time: 08/11/16  5:48 AM  Result Value Ref Range   WBC 11.5 (H) 3.6 - 11.0 K/uL   RBC 3.07 (L) 3.80 - 5.20 MIL/uL   Hemoglobin 9.9 (L) 12.0 - 16.0 g/dL   HCT 98.128.5 (L) 19.135.0 - 47.847.0 %   MCV 92.7 80.0 - 100.0 fL   MCH 32.2 26.0 - 34.0 pg   MCHC 34.7 32.0 - 36.0 g/dL   RDW 29.514.4 62.111.5 - 30.814.5 %   Platelets 127 (L) 150 - 440 K/uL    Discharge Information: interpreter at bedside for dc instructions Date: 08/10/2016 Activity: pelvic rest Diet: routine Medications: Ibuprofen and FE Condition: stable Instructions: refer to practice specific booklet. Needs 6 week 75 gram 2 hr Glucola test  Discharge to: home  Contraception: planning OCPs at 6 weeks    Follow-up Information    [facility] Schedule an appointment as soon as possible for a visit in 1 week(s).   Why:  1 week  for BP check, then at 6 weeks for Postpartum visit - needs 2 hour glucose test at that visit.  Please schedule  Contact information: [address] [place] [phone]          Newborn Data: Live born female  Birth Weight: 6 lb 14.4 oz (3130 g) APGAR: 8, 9  Home with mother.  SCHERMERHORN,THOMAS 08/10/2016, 8:30 AM   Carlean JewsMeredith Sigmon, CNM 08/11/16

## 2016-08-11 LAB — CBC
HCT: 28.5 % — ABNORMAL LOW (ref 35.0–47.0)
HEMOGLOBIN: 9.9 g/dL — AB (ref 12.0–16.0)
MCH: 32.2 pg (ref 26.0–34.0)
MCHC: 34.7 g/dL (ref 32.0–36.0)
MCV: 92.7 fL (ref 80.0–100.0)
Platelets: 127 10*3/uL — ABNORMAL LOW (ref 150–440)
RBC: 3.07 MIL/uL — ABNORMAL LOW (ref 3.80–5.20)
RDW: 14.4 % (ref 11.5–14.5)
WBC: 11.5 10*3/uL — AB (ref 3.6–11.0)

## 2016-08-11 MED ORDER — SCOPOLAMINE 1 MG/3DAYS TD PT72: 1.0000 | MEDICATED_PATCH | Freq: Once | TRANSDERMAL | Status: DC

## 2016-08-11 MED ORDER — DEXTROSE 5 % IV SOLN
1.0000 ug/kg/h | INTRAVENOUS | Status: DC | PRN
  Filled 2016-08-11: qty 2

## 2016-08-11 MED ORDER — ONDANSETRON HCL 4 MG/2ML IJ SOLN: 4.0000 mg | Freq: Three times a day (TID) | INTRAMUSCULAR | Status: DC | PRN

## 2016-08-11 MED ORDER — DIPHENHYDRAMINE HCL 50 MG/ML IJ SOLN: 12.5000 mg | INTRAMUSCULAR | Status: DC | PRN

## 2016-08-11 MED ORDER — IBUPROFEN 600 MG PO TABS
600.0000 mg | ORAL_TABLET | Freq: Four times a day (QID) | ORAL | 0 refills | Status: DC
Start: 2016-08-11 — End: 2019-01-23

## 2016-08-11 MED ORDER — NALBUPHINE HCL 10 MG/ML IJ SOLN: 5.0000 mg | INTRAMUSCULAR | Status: DC | PRN

## 2016-08-11 MED ORDER — MEPERIDINE HCL 25 MG/ML IJ SOLN: 6.2500 mg | INTRAMUSCULAR | Status: DC | PRN

## 2016-08-11 MED ORDER — SODIUM CHLORIDE 0.9% FLUSH: 3.0000 mL | INTRAVENOUS | Status: DC | PRN

## 2016-08-11 MED ORDER — NALBUPHINE HCL 10 MG/ML IJ SOLN: 5.0000 mg | Freq: Once | INTRAMUSCULAR | Status: DC | PRN

## 2016-08-11 MED ORDER — DIPHENHYDRAMINE HCL 25 MG PO CAPS: 25.0000 mg | ORAL_CAPSULE | ORAL | Status: DC | PRN

## 2016-08-11 MED ORDER — NALOXONE HCL 0.4 MG/ML IJ SOLN: 0.4000 mg | INTRAMUSCULAR | Status: DC | PRN

## 2016-08-11 MED ORDER — FERROUS SULFATE 325 (65 FE) MG PO TABS: 325.0000 mg | ORAL_TABLET | Freq: Two times a day (BID) | ORAL | 3 refills | Status: AC

## 2016-08-11 MED ORDER — FENTANYL 2.5 MCG/ML W/ROPIVACAINE 0.2% IN NS 100 ML EPIDURAL INFUSION (ARMC-ANES): 10.0000 mL/h | EPIDURAL | Status: DC

## 2016-08-11 NOTE — Progress Notes (Signed)
Discharge inst reviewed with mom via interpreter Loyda.  Rx given for home use.  Explained to pt the need for BP f/u check in 1 wk at [facility].

## 2016-08-11 NOTE — Anesthesia Postprocedure Evaluation (Signed)
Anesthesia Post Note  Patient: Karla Powers  Procedure(s) Performed: * No procedures listed *  Patient location during evaluation: Mother Baby Anesthesia Type: Epidural Level of consciousness: awake and alert, oriented and patient cooperative Pain management: pain level controlled Vital Signs Assessment: post-procedure vital signs reviewed and stable Respiratory status: spontaneous breathing, nonlabored ventilation and respiratory function stable Cardiovascular status: stable Postop Assessment: no headache, no backache and epidural receding Anesthetic complications: no    Last Vitals:  Vitals:   08/11/16 0331 08/11/16 0725  BP: 113/61 114/68  Pulse: 64 73  Resp:  18  Temp:  36.7 C    Last Pain:  Vitals:   08/11/16 0725  TempSrc: Oral  PainSc:                  Lenward ChancellorSavage,  Mariamawit Depaoli A

## 2016-08-11 NOTE — Progress Notes (Signed)
Discharged to home to car via auxillary 

## 2016-12-05 ENCOUNTER — Encounter (HOSPITAL_COMMUNITY): Payer: Self-pay

## 2019-01-21 ENCOUNTER — Other Ambulatory Visit: Payer: Self-pay

## 2019-01-21 ENCOUNTER — Emergency Department: Payer: Self-pay

## 2019-01-21 ENCOUNTER — Encounter: Payer: Self-pay | Admitting: *Deleted

## 2019-01-21 ENCOUNTER — Inpatient Hospital Stay
Admission: EM | Admit: 2019-01-21 | Discharge: 2019-01-23 | DRG: 872 | Disposition: A | Discharge status: Home / Self Care | Payer: Self-pay | Attending: Internal Medicine | Admitting: Internal Medicine

## 2019-01-21 DIAGNOSIS — I10 Essential (primary) hypertension: Secondary | ICD-10-CM | POA: Diagnosis present

## 2019-01-21 DIAGNOSIS — Z8632 Personal history of gestational diabetes: Secondary | ICD-10-CM

## 2019-01-21 DIAGNOSIS — A419 Sepsis, unspecified organism: Secondary | ICD-10-CM | POA: Diagnosis present

## 2019-01-21 DIAGNOSIS — Z20828 Contact with and (suspected) exposure to other viral communicable diseases: Secondary | ICD-10-CM | POA: Diagnosis present

## 2019-01-21 DIAGNOSIS — E876 Hypokalemia: Secondary | ICD-10-CM | POA: Diagnosis present

## 2019-01-21 DIAGNOSIS — R6510 Systemic inflammatory response syndrome (SIRS) of non-infectious origin without acute organ dysfunction: Secondary | ICD-10-CM

## 2019-01-21 DIAGNOSIS — R739 Hyperglycemia, unspecified: Secondary | ICD-10-CM | POA: Diagnosis present

## 2019-01-21 DIAGNOSIS — A4151 Sepsis due to Escherichia coli [E. coli]: Principal | ICD-10-CM | POA: Diagnosis present

## 2019-01-21 DIAGNOSIS — R000 Tachycardia, unspecified: Secondary | ICD-10-CM | POA: Diagnosis present

## 2019-01-21 DIAGNOSIS — N390 Urinary tract infection, site not specified: Secondary | ICD-10-CM | POA: Diagnosis present

## 2019-01-21 LAB — CBC WITH DIFFERENTIAL/PLATELET
Abs Immature Granulocytes: 0.19 10*3/uL — ABNORMAL HIGH (ref 0.00–0.07)
Basophils Absolute: 0.1 10*3/uL (ref 0.0–0.1)
Basophils Relative: 0 %
Eosinophils Absolute: 0 10*3/uL (ref 0.0–0.5)
Eosinophils Relative: 0 %
HCT: 39.2 % (ref 36.0–46.0)
Hemoglobin: 13.2 g/dL (ref 12.0–15.0)
Immature Granulocytes: 1 %
Lymphocytes Relative: 5 %
Lymphs Abs: 1.1 10*3/uL (ref 0.7–4.0)
MCH: 30 pg (ref 26.0–34.0)
MCHC: 33.7 g/dL (ref 30.0–36.0)
MCV: 89.1 fL (ref 80.0–100.0)
Monocytes Absolute: 1.1 10*3/uL — ABNORMAL HIGH (ref 0.1–1.0)
Monocytes Relative: 5 %
Neutro Abs: 22.1 10*3/uL — ABNORMAL HIGH (ref 1.7–7.7)
Neutrophils Relative %: 89 %
Platelets: 302 10*3/uL (ref 150–400)
RBC: 4.4 MIL/uL (ref 3.87–5.11)
RDW: 12.7 % (ref 11.5–15.5)
WBC: 24.6 10*3/uL — ABNORMAL HIGH (ref 4.0–10.5)
nRBC: 0 % (ref 0.0–0.2)

## 2019-01-21 LAB — SARS CORONAVIRUS 2 BY RT PCR (HOSPITAL ORDER, PERFORMED IN ~~LOC~~ HOSPITAL LAB): SARS Coronavirus 2: NEGATIVE

## 2019-01-21 LAB — COMPREHENSIVE METABOLIC PANEL
ALT: 17 U/L (ref 0–44)
AST: 18 U/L (ref 15–41)
Albumin: 3.9 g/dL (ref 3.5–5.0)
Alkaline Phosphatase: 77 U/L (ref 38–126)
Anion gap: 13 (ref 5–15)
BUN: 10 mg/dL (ref 6–20)
CO2: 20 mmol/L — ABNORMAL LOW (ref 22–32)
Calcium: 8.8 mg/dL — ABNORMAL LOW (ref 8.9–10.3)
Chloride: 105 mmol/L (ref 98–111)
Creatinine, Ser: 0.76 mg/dL (ref 0.44–1.00)
GFR calc Af Amer: >60 mL/min (ref >60)
GFR calc non Af Amer: >60 mL/min (ref >60)
Glucose, Bld: 153 mg/dL — ABNORMAL HIGH (ref 70–99)
Potassium: 3.1 mmol/L — ABNORMAL LOW (ref 3.5–5.1)
Sodium: 138 mmol/L (ref 135–145)
Total Bilirubin: 2 mg/dL — ABNORMAL HIGH (ref 0.3–1.2)
Total Protein: 8.1 g/dL (ref 6.5–8.1)

## 2019-01-21 LAB — BLOOD CULTURE ID PANEL (REFLEXED)

## 2019-01-21 LAB — PREGNANCY, URINE: Preg Test, Ur: NEGATIVE

## 2019-01-21 LAB — BLOOD GAS, VENOUS
Acid-base deficit: 0.3 mmol/L (ref 0.0–2.0)
Bicarbonate: 22.8 mmol/L (ref 20.0–28.0)
O2 Saturation: 92 %
Patient temperature: 37
pCO2, Ven: 32 mmHg — ABNORMAL LOW (ref 44.0–60.0)
pH, Ven: 7.46 — ABNORMAL HIGH (ref 7.250–7.430)
pO2, Ven: 60 mmHg — ABNORMAL HIGH (ref 32.0–45.0)

## 2019-01-21 LAB — URINALYSIS, COMPLETE (UACMP) WITH MICROSCOPIC
Bacteria, UA: NONE SEEN
Bilirubin Urine: NEGATIVE
Glucose, UA: NEGATIVE mg/dL
Ketones, ur: NEGATIVE mg/dL
Nitrite: NEGATIVE
Protein, ur: 30 mg/dL — AB
Specific Gravity, Urine: 1.005 (ref 1.005–1.030)
WBC, UA: >50 WBC/hpf — ABNORMAL HIGH (ref 0–5)
pH: 7 (ref 5.0–8.0)

## 2019-01-21 LAB — APTT: aPTT: 29 seconds (ref 24–36)

## 2019-01-21 LAB — PROTIME-INR
INR: 1.2 (ref 0.8–1.2)
Prothrombin Time: 14.6 seconds (ref 11.4–15.2)

## 2019-01-21 LAB — LACTIC ACID, PLASMA: Lactic Acid, Venous: 1.8 mmol/L (ref 0.5–1.9)

## 2019-01-21 LAB — PROCALCITONIN: Procalcitonin: 0.8 ng/mL

## 2019-01-21 MED ORDER — ACETAMINOPHEN 325 MG PO TABS
650.0000 mg | ORAL_TABLET | Freq: Four times a day (QID) | ORAL | Status: DC | PRN
  Administered 2019-01-21 – 2019-01-22 (×4): 650 mg via ORAL
  Filled 2019-01-21 (×4): qty 2

## 2019-01-21 MED ORDER — ACETAMINOPHEN 650 MG RE SUPP: 650.0000 mg | Freq: Four times a day (QID) | RECTAL | Status: DC | PRN

## 2019-01-21 MED ORDER — SODIUM CHLORIDE 0.9% FLUSH: 3.0000 mL | Freq: Once | INTRAVENOUS | Status: DC

## 2019-01-21 MED ORDER — SODIUM CHLORIDE 0.9 % IV SOLN
INTRAVENOUS | Status: DC
  Administered 2019-01-21 – 2019-01-23 (×4): via INTRAVENOUS

## 2019-01-21 MED ORDER — POTASSIUM CHLORIDE 20 MEQ PO PACK
20.0000 meq | PACK | Freq: Once | ORAL | Status: AC
  Administered 2019-01-21: 09:00:00 20 meq via ORAL
  Filled 2019-01-21: qty 1

## 2019-01-21 MED ORDER — PIPERACILLIN-TAZOBACTAM 3.375 G IVPB 30 MIN
3.3750 g | Freq: Once | INTRAVENOUS | Status: AC
  Administered 2019-01-21: 04:00:00 3.375 g via INTRAVENOUS

## 2019-01-21 MED ORDER — SODIUM CHLORIDE 0.9 % IV SOLN
2.0000 g | INTRAVENOUS | Status: DC
  Administered 2019-01-22 – 2019-01-23 (×2): 2 g via INTRAVENOUS
  Filled 2019-01-21: qty 2
  Filled 2019-01-21: qty 20
  Filled 2019-01-21: qty 2

## 2019-01-21 MED ORDER — POLYETHYLENE GLYCOL 3350 17 G PO PACK: 17.0000 g | PACK | Freq: Every day | ORAL | Status: DC | PRN

## 2019-01-21 MED ORDER — ONDANSETRON HCL 4 MG PO TABS: 4.0000 mg | ORAL_TABLET | Freq: Four times a day (QID) | ORAL | Status: DC | PRN

## 2019-01-21 MED ORDER — SODIUM CHLORIDE 0.9 % IV SOLN
Freq: Once | INTRAVENOUS | Status: AC
  Administered 2019-01-21: 04:00:00 via INTRAVENOUS

## 2019-01-21 MED ORDER — ONDANSETRON HCL 4 MG/2ML IJ SOLN: 4.0000 mg | Freq: Four times a day (QID) | INTRAMUSCULAR | Status: DC | PRN

## 2019-01-21 MED ORDER — ADULT MULTIVITAMIN W/MINERALS CH
1.0000 | ORAL_TABLET | Freq: Every day | ORAL | Status: DC
  Administered 2019-01-21 – 2019-01-23 (×3): 1 via ORAL
  Filled 2019-01-21 (×3): qty 1

## 2019-01-21 MED ORDER — SODIUM CHLORIDE 0.9 % IV BOLUS
1000.0000 mL | Freq: Once | INTRAVENOUS | Status: AC
  Administered 2019-01-21: 15:00:00 1000 mL via INTRAVENOUS

## 2019-01-21 MED ORDER — SODIUM CHLORIDE 0.9 % IV SOLN
1.0000 g | INTRAVENOUS | Status: DC
  Administered 2019-01-21: 12:00:00 1 g via INTRAVENOUS
  Filled 2019-01-21 (×2): qty 10

## 2019-01-21 MED ORDER — ENOXAPARIN SODIUM 40 MG/0.4ML ~~LOC~~ SOLN
40.0000 mg | SUBCUTANEOUS | Status: DC
  Administered 2019-01-21 – 2019-01-23 (×3): 40 mg via SUBCUTANEOUS
  Filled 2019-01-21 (×3): qty 0.4

## 2019-01-21 MED ORDER — VANCOMYCIN HCL IN DEXTROSE 1-5 GM/200ML-% IV SOLN
1000.0000 mg | Freq: Once | INTRAVENOUS | Status: AC
  Administered 2019-01-21: 04:00:00 1000 mg via INTRAVENOUS

## 2019-01-21 MED ORDER — FERROUS SULFATE 325 (65 FE) MG PO TABS
325.0000 mg | ORAL_TABLET | Freq: Two times a day (BID) | ORAL | Status: DC
  Administered 2019-01-21 – 2019-01-23 (×5): 325 mg via ORAL
  Filled 2019-01-21 (×5): qty 1

## 2019-01-21 NOTE — ED Notes (Signed)
Report given to Deborha Payment, RN

## 2019-01-21 NOTE — Plan of Care (Signed)
Pt admitted from the ED.  Tachycardic on admission with low grade fever.  1L bolus given with improvement.  Pt c/o L flank pain, tylenol given with good effect.  Ate well. Denies n/v.

## 2019-01-21 NOTE — H&P (Signed)
Attending physician admission note:  I have seen and examined the patient with my Nurse practitioner, Ernestine Mcmurray, NP.  The patient presents with dysuria and fever that was noted to have a UTI and leukocytosis of 24.6 with hypokalemia of 3.1 and elevated lactic acid 1.8.  She was tachycardic to 150s.  Upon physical examination:  Generally: Pleasant middle-aged Hispanic female in no acute distress.  Skin is warm. Cardiovascular: Regular rate and rhythm with normal S1-S2 and no murmurs gallops or rubs. Respiratory: Clear to auscultation bilaterally Abdomen: Soft, nontender, nondistended with positive bowel sounds and no palpable megaly or masses. Extremities: No edema clubbing or cyanosis  Labs and radiographic studies: Reviewed  Assessment/plan: Patient will be admitted to a medical bed for further management of UTI with associated sepsis.  She will be placed on IV Rocephin and hydrated with IV normal saline.  Her tachycardia is likely secondary to volume depletion.  We will follow urine and blood cultures.  For further details please refer to dictated admission H&P.  I have discussed the case with my nurse practitioner. I agree with the admission note and the rest of the plan of care as delineated by my nurse practitioner.

## 2019-01-21 NOTE — ED Provider Notes (Signed)
[facility] Emergency Department Provider Note       Time seen: ----------------------------------------- 2:56 AM on 01/21/2019 -----------------------------------------   I have reviewed the triage vital signs and the nursing notes.  HISTORY   Chief Complaint Fever and Dysuria    HPI Karla Powers is a 43 y.o. female with a history of diabetes who presents to the ED for fever.  Patient's had fever and chills with dysuria for the past 2 weeks.  She has been unable to see a physician.  She last took Tylenol at 11 PM.  She does not have any pain.  She denies any flulike symptoms other than the fever and chills, denies sore throat, cough or coronavirus exposure.  Past Medical History:  Diagnosis Date  . Diabetes mellitus without complication (HCC)    with this pregnancy, diet control only    Patient Active Problem List   Diagnosis Date Noted  . Hypertension affecting pregnancy in third trimester 08/08/2016  . Gestational hypertension 08/05/2016  . Advanced maternal age in multigravida 01/31/2016    No past surgical history on file.  Allergies Patient has no known allergies.  Social History Social History   Tobacco Use  . Smoking status: Never Smoker  . Smokeless tobacco: Never Used  Substance Use Topics  . Alcohol use: No  . Drug use: No    Review of Systems Constitutional: Positive for fevers, chills Cardiovascular: Negative for chest pain. Respiratory: Negative for shortness of breath. Gastrointestinal: Negative for abdominal pain, vomiting and diarrhea. Genitourinary: Positive for dysuria Musculoskeletal: Negative for back pain. Skin: Negative for rash. Neurological: Negative for headaches, focal weakness or numbness.  All systems negative/normal/unremarkable except as stated in the HPI  ____________________________________________   PHYSICAL EXAM:  VITAL SIGNS: ED Triage Vitals  Enc Vitals Group     BP 01/21/19  0249 (!) 140/93     Pulse Rate 01/21/19 0249 (!) 161     Resp 01/21/19 0249 (!) 36     Temp 01/21/19 0249 (!) 103 F (39.4 C)     Temp Source 01/21/19 0249 Oral     SpO2 01/21/19 0249 96 %     Weight --      Height --      Head Circumference --      Peak Flow --      Pain Score 01/21/19 0253 0     Pain Loc --      Pain Edu? --      Excl. in GC? --    Constitutional: Alert and oriented. Well appearing and in no distress. Eyes: Conjunctivae are normal. Normal extraocular movements. ENT      Head: Normocephalic and atraumatic.      Nose: No congestion/rhinnorhea.      Mouth/Throat: Mucous membranes are moist.      Neck: No stridor. Cardiovascular: Rapid rate, regular rhythm. No murmurs, rubs, or gallops. Respiratory: Normal respiratory effort without tachypnea nor retractions. Breath sounds are clear and equal bilaterally. No wheezes/rales/rhonchi. Gastrointestinal: Soft and nontender. Normal bowel sounds Musculoskeletal: Nontender with normal range of motion in extremities. No lower extremity tenderness nor edema. Neurologic:  Normal speech and language. No gross focal neurologic deficits are appreciated.  Skin:  Skin is warm, dry and intact. No rash noted. Psychiatric: Mood and affect are normal. Speech and behavior are normal.  ____________________________________________  EKG: Interpreted by me.  Sinus tachycardia with a rate of 127 bpm, normal PR interval, normal axis, nonspecific T wave abnormalities  ____________________________________________  ED COURSE:  As part of my medical decision making, I reviewed the following data within the electronic MEDICAL RECORD NUMBER History obtained from family if available, nursing notes, old chart and ekg, as well as notes from prior ED visits. Patient presented for fever, chills, dysuria, we will assess with labs and imaging as indicated at this time. Clinical Course as of Jan 20 413  Fri Jan 21, 2019  0334 HR 135 currently   [JW]     Clinical Course User Index [JW] Emily Filbert, MD   Procedures  Karla Powers was evaluated in Emergency Department on 01/21/2019 for the symptoms described in the history of present illness. She was evaluated in the context of the global COVID-19 pandemic, which necessitated consideration that the patient might be at risk for infection with the SARS-CoV-2 virus that causes COVID-19. Institutional protocols and algorithms that pertain to the evaluation of patients at risk for COVID-19 are in a state of rapid change based on information released by regulatory bodies including the CDC and federal and state organizations. These policies and algorithms were followed during the patient's care in the ED.  ____________________________________________   LABS (pertinent positives/negatives)  Labs Reviewed  COMPREHENSIVE METABOLIC PANEL - Abnormal; Notable for the following components:      Result Value   Potassium 3.1 (*)    CO2 20 (*)    Glucose, Bld 153 (*)    Calcium 8.8 (*)    Total Bilirubin 2.0 (*)    All other components within normal limits  CBC WITH DIFFERENTIAL/PLATELET - Abnormal; Notable for the following components:   WBC 24.6 (*)    Neutro Abs 22.1 (*)    Monocytes Absolute 1.1 (*)    Abs Immature Granulocytes 0.19 (*)    All other components within normal limits  URINALYSIS, COMPLETE (UACMP) WITH MICROSCOPIC - Abnormal; Notable for the following components:   Color, Urine YELLOW (*)    APPearance HAZY (*)    Hgb urine dipstick MODERATE (*)    Protein, ur 30 (*)    Leukocytes,Ua LARGE (*)    WBC, UA >50 (*)    All other components within normal limits  BLOOD GAS, VENOUS - Abnormal; Notable for the following components:   pH, Ven 7.46 (*)    pCO2, Ven 32 (*)    pO2, Ven 60.0 (*)    All other components within normal limits  CULTURE, BLOOD (ROUTINE X 2)  CULTURE, BLOOD (ROUTINE X 2)  URINE CULTURE  SARS CORONAVIRUS 2 (HOSPITAL ORDER, PERFORMED IN [facility])  LACTIC ACID, PLASMA  LACTIC ACID, PLASMA  PREGNANCY, URINE   CRITICAL CARE Performed by: Ulice Dash   Total critical care time: 30 minutes  Critical care time was exclusive of separately billable procedures and treating other patients.  Critical care was necessary to treat or prevent imminent or life-threatening deterioration.  Critical care was time spent personally by me on the following activities: development of treatment plan with patient and/or surrogate as well as nursing, discussions with consultants, evaluation of patient's response to treatment, examination of patient, obtaining history from patient or surrogate, ordering and performing treatments and interventions, ordering and review of laboratory studies, ordering and review of radiographic studies, pulse oximetry and re-evaluation of patient's condition.  ____________________________________________   DIFFERENTIAL DIAGNOSIS   UTI, pyelonephritis, urosepsis, coronavirus, dehydration, electrolyte abnormality  FINAL ASSESSMENT AND PLAN  UTI, possible urosepsis   Plan: The patient had presented for fever and chills  with dysuria for the past 2 weeks. Patient's labs did reveal significant leukocytosis and significant urinary tract infection concerning for urosepsis.  She did not require any imaging while in the ER.  She does not have any pain or signs of pyelonephritis.  It is unclear as to why she would have gotten so sick with no previous medical conditions.  She reports gestational diabetes but no persistent diabetes mellitus.  She received IV fluids, broad-spectrum antibiotics, I will discuss with the hospitalist for admission.   Ulice Dash, MD    Note: This note was generated in part or whole with voice recognition software. Voice recognition is usually quite accurate but there are transcription errors that can and very often do occur. I apologize for any typographical errors  that were not detected and corrected.     Emily Filbert, MD 01/21/19 [phone]

## 2019-01-21 NOTE — Progress Notes (Signed)
Patient was seen and examined with the help of Spanish interpreter.  Patient is tachycardic but denies any symptoms.  A fluid bolus will be given and continue IV fluids and IV antibiotics Continue current management

## 2019-01-21 NOTE — Progress Notes (Signed)
CODE SEPSIS - PHARMACY COMMUNICATION  **Broad Spectrum Antibiotics should be administered within 1 hour of Sepsis diagnosis**  Time Code Sepsis Called/Page Received: 0301  Antibiotics Ordered: vanc/zosyn  Time of 1st antibiotic administration: 0332  Additional action taken by pharmacy:   If necessary, Name of Provider/Nurse Contacted:     Thomasene Ripple ,PharmD Clinical Pharmacist  01/21/2019  3:40 AM

## 2019-01-21 NOTE — H&P (Signed)
[employer] [facility] [place] [facility]   PATIENT NAME: Karla Powers    MR#:  [id number]  DATE OF BIRTH:  07-10-1976  DATE OF ADMISSION:  01/21/2019  PRIMARY CARE PHYSICIAN: Department, [facility]   REQUESTING/REFERRING PHYSICIAN: Daryel November, MD  CHIEF COMPLAINT:   Chief Complaint  Patient presents with  . Fever  . Dysuria    HISTORY OF PRESENT ILLNESS:  Karla Powers  is a 43 y.o. female with a known history of gestational diabetes.  She presented to the emergency room complaining of fever with chills and dysuria which have become worse over the last 24 hours.  Patient reports a 2-week history of intermittent fevers and chills as well as dysuria and urine frequency with urgency.  Over the last 7 days she has been taking over-the-counter Pyridium with no relief in symptoms.  She denies having had prior antibiotic treatment for current illness.  She denies abdominal pain.  She denies vomiting however she has noticed occasional periods of nausea.  She denies hematuria.  She denies diarrhea.  She denies chest pain or shortness of breath.  She denies cough.  Labs on arrival demonstrate leukocytosis with WBC 24.6.  Also hypokalemia with potassium 3.1.  Urinalysis demonstrates large leukocytes.  Lactic acid is 1.8.  Patient is tachycardic on arrival with heart rate in the 150s with fever of 103.0.  Urine culture is pending.  She has been admitted to the hospitalist service for further evaluation and management.  PAST MEDICAL HISTORY:   Past Medical History:  Diagnosis Date  . Diabetes mellitus without complication (HCC)    with this pregnancy, diet control only    PAST SURGICAL HISTORY:  History reviewed. No pertinent surgical history.  SOCIAL HISTORY:   Social History   Tobacco Use  . Smoking status: Never Smoker  . Smokeless tobacco: Never Used  Substance Use Topics  . Alcohol use: No    FAMILY HISTORY:  History reviewed. No  pertinent family history.  DRUG ALLERGIES:  No Known Allergies  REVIEW OF SYSTEMS:   Review of Systems  Constitutional: Positive for chills, fever and malaise/fatigue.  HENT: Negative for congestion, sinus pain and sore throat.   Eyes: Negative for blurred vision and double vision.  Respiratory: Negative for cough, sputum production and shortness of breath.   Cardiovascular: Positive for chest pain. Negative for palpitations.  Gastrointestinal: Positive for nausea. Negative for abdominal pain, constipation, diarrhea, heartburn and vomiting.  Genitourinary: Positive for dysuria, frequency and urgency. Negative for flank pain and hematuria.  Musculoskeletal: Negative for joint pain, myalgias and neck pain.  Skin: Negative for itching and rash.  Neurological: Negative for dizziness, loss of consciousness and headaches.  Psychiatric/Behavioral: Negative.       MEDICATIONS AT HOME:   Prior to Admission medications   Medication Sig Start Date End Date Taking? Authorizing Provider  ferrous sulfate 325 (65 FE) MG tablet Take 1 tablet (325 mg total) by mouth 2 (two) times daily with a meal. Patient not taking: Reported on 01/21/2019 08/11/16   Karena Addison, CNM  ibuprofen (ADVIL,MOTRIN) 600 MG tablet Take 1 tablet (600 mg total) by mouth every 6 (six) hours. Patient not taking: Reported on 01/21/2019 08/11/16   Karena Addison, CNM  Prenatal Vit-Fe Fumarate-FA (MULTIVITAMIN-PRENATAL) 27-0.8 MG TABS tablet Take 1 tablet by mouth daily at 12 noon.    [provider]      VITAL SIGNS:  Blood pressure 130/88, pulse (!) 124, temperature (!)  103 F (39.4 C), temperature source Oral, resp. rate 13, last menstrual period 01/16/2019, SpO2 99 %, not currently breastfeeding.  PHYSICAL EXAMINATION:  Physical Exam Vitals signs and nursing note reviewed.  Constitutional:      General: She is not in acute distress.    Appearance: Normal appearance. She is normal weight. She is not  ill-appearing.  HENT:     Head: Normocephalic.     Nose: Nose normal. No congestion.     Mouth/Throat:     Mouth: Mucous membranes are moist.     Pharynx: Oropharynx is clear.  Eyes:     General: No scleral icterus.    Conjunctiva/sclera: Conjunctivae normal.     Pupils: Pupils are equal, round, and reactive to light.  Neck:     Musculoskeletal: Normal range of motion and neck supple.  Cardiovascular:     Rate and Rhythm: Tachycardia present.     Pulses: Normal pulses.     Heart sounds: Normal heart sounds. No murmur. No gallop.   Pulmonary:     Effort: Pulmonary effort is normal. No respiratory distress.     Breath sounds: Normal breath sounds. No wheezing, rhonchi or rales.  Abdominal:     General: Abdomen is flat. Bowel sounds are normal. There is no distension.     Palpations: Abdomen is soft.     Tenderness: There is no abdominal tenderness. There is no right CVA tenderness or left CVA tenderness.     Hernia: No hernia is present.  Musculoskeletal: Normal range of motion.        General: No swelling or tenderness.  Lymphadenopathy:     Cervical: No cervical adenopathy.  Skin:    General: Skin is warm and dry.     Capillary Refill: Capillary refill takes less than 2 seconds.     Coloration: Skin is not jaundiced.     Findings: No erythema or rash.  Neurological:     General: No focal deficit present.     Mental Status: She is alert and oriented to person, place, and time. Mental status is at baseline.  Psychiatric:        Mood and Affect: Mood normal.        Behavior: Behavior normal.     LABORATORY PANEL:   CBC Recent Labs  Lab 01/21/19 0319  WBC 24.6*  HGB 13.2  HCT 39.2  PLT 302   ------------------------------------------------------------------------------------------------------------------  Chemistries  Recent Labs  Lab 01/21/19 0319  NA 138  K 3.1*  CL 105  CO2 20*  GLUCOSE 153*  BUN 10  CREATININE 0.76  CALCIUM 8.8*  AST 18  ALT 17   ALKPHOS 77  BILITOT 2.0*   ------------------------------------------------------------------------------------------------------------------  Cardiac Enzymes No results for input(s): TROPONINI in the last 168 hours. ------------------------------------------------------------------------------------------------------------------  RADIOLOGY:  Dg Chest Port 1 View  Result Date: 01/21/2019 CLINICAL DATA:  Fever, chills EXAM: PORTABLE CHEST 1 VIEW COMPARISON:  None. FINDINGS: Heart and mediastinal contours are within normal limits. No focal opacities or effusions. No acute bony abnormality. IMPRESSION: No active disease. Electronically Signed   By: Charlett NoseKevin  Dover M.D.   On: 01/21/2019 03:27      IMPRESSION AND PLAN:   1.  Sepsis - Likely urosepsis, patient received 2 L normal saline bolus currently with normal saline infusing at 125 cc/h through peripheral IV.  Initial lactic acid is 1.8.  Patient tachycardic on arrival. - We will continue to monitor heart rate closely although this is likely secondary to infectious process.  Patient will be on telemetry monitoring - Blood and urine cultures are pending.  Will review results and adjust therapy accordingly  2.  Urinary tract infection - Treated with IV Rocephin initially in the emergency room and this has been continued. - As previously stated, urine cultures are pending.  Will adjust treatment expectantly when results are available for review.  3. hyperglycemia - We will get hemoglobin A1c as patient has a history of gestational diabetes - Patient will have BMP in the a.m.  We will continue to monitor glucose levels.  4. hypokalemia - Patient will have p.o. potassium replacement - Will repeat BMP in the a.m. - Patient will have telemetry monitoring for possible arrhythmias  All the records are reviewed and case discussed with ED provider. The plan of care was discussed in details with the patient (and family). I answered all  questions. The patient agreed to proceed with the above mentioned plan. Further management will depend upon hospital course.   CODE STATUS: Full code  TOTAL TIME TAKING CARE OF THIS PATIENT:45 minutes.    Milas Kocher Karla Powers CRNP on 01/21/2019 at 5:17 AM  Pager - [phone]  After 6pm go to [email or web address] - Social research officer, government  [facility] [place] [facility]  Office  [phone]  CC: Primary care physician; Department, [facility]   Note: This dictation was prepared with Dragon dictation along with smaller phrase technology. Any transcriptional errors that result from this process are unintentional.

## 2019-01-21 NOTE — ED Triage Notes (Signed)
Pt presents w/ fever x 1 day. Pt had chills. Pt states dysuria x 2 weeks, unable to see a physician. Pt last took tylenol at 2300.

## 2019-01-21 NOTE — ED Notes (Signed)
.. ED TO INPATIENT HANDOFF REPORT  ED Nurse Name and Phone #: Pattricia Boss [phone]  S Name/Age/Gender Karla Powers 43 y.o. female Room/Bed: ED07A/ED07A  Code Status   Code Status: Full Code  Home/SNF/Other Home Patient oriented to: self, place, time and situation Is this baseline? Yes   Triage Complete: Triage complete  Chief Complaint Fever  Triage Note Pt presents w/ fever x 1 day. Pt had chills. Pt states dysuria x 2 weeks, unable to see a physician. Pt last took tylenol at 2300.   Allergies No Known Allergies  Level of Care/Admitting Diagnosis ED Disposition    ED Disposition Condition Comment   Admit  Hospital Area: [facility] [[id number]]  Level of Care: Med-Surg [16]  Covid Evaluation: N/A  Diagnosis: Sepsis [facility]) [[id number]]  Admitting Physician: Hannah Beat [[id number]]  Attending Physician: Hannah Beat [[id number]]  Estimated length of stay: past midnight tomorrow  Certification:: I certify this patient will need inpatient services for at least 2 midnights  PT Class (Do Not Modify): Inpatient [101]  PT Acc Code (Do Not Modify): Private [1]       B Medical/Surgery History Past Medical History:  Diagnosis Date  . Diabetes mellitus without complication (HCC)    with this pregnancy, diet control only   History reviewed. No pertinent surgical history.   A IV Location/Drains/Wounds Patient Lines/Drains/Airways Status   Active Line/Drains/Airways    Name:   Placement date:   Placement time:   Site:   Days:   Peripheral IV 01/21/19 Right Antecubital   01/21/19    0331    Antecubital   less than 1   Peripheral IV 01/21/19 Right Antecubital   01/21/19    0331    Antecubital   less than 1   Epidural Catheter 08/09/16   08/09/16    1830     895          Intake/Output Last 24 hours  Intake/Output Summary (Last 24 hours) at 01/21/2019 0622 Last data filed at 01/21/2019 0550 Gross per 24 hour  Intake 2250 ml  Output -  Net 2250 ml     Labs/Imaging Results for orders placed or performed during the hospital encounter of 01/21/19 (from the past 48 hour(s))  Blood gas, venous ([facility], [facility], [facility])     Status: Abnormal   Collection Time: 01/21/19  3:00 AM  Result Value Ref Range   pH, Ven 7.46 (H) 7.250 - 7.430   pCO2, Ven 32 (L) 44.0 - 60.0 mmHg   pO2, Ven 60.0 (H) 32.0 - 45.0 mmHg   Bicarbonate 22.8 20.0 - 28.0 mmol/L   Acid-base deficit 0.3 0.0 - 2.0 mmol/L   O2 Saturation 92.0 %   Patient temperature 37.0    Collection site VENOUS    Sample type VENOUS     Comment: Performed at [facility], [address] [place], [place] [address]  Lactic acid, plasma     Status: None   Collection Time: 01/21/19  3:19 AM  Result Value Ref Range   Lactic Acid, Venous 1.8 0.5 - 1.9 mmol/L    Comment: Performed at [facility], [address] [place], [place] [address]  Comprehensive metabolic panel     Status: Abnormal   Collection Time: 01/21/19  3:19 AM  Result Value Ref Range   Sodium 138 135 - 145 mmol/L   Potassium 3.1 (L) 3.5 - 5.1 mmol/L   Chloride 105 98 - 111 mmol/L   CO2  20 (L) 22 - 32 mmol/L   Glucose, Bld 153 (H) 70 - 99 mg/dL   BUN 10 6 - 20 mg/dL   Creatinine, Ser 0.450.76 0.44 - 1.00 mg/dL   Calcium 8.8 (L) 8.9 - 10.3 mg/dL   Total Protein 8.1 6.5 - 8.1 g/dL   Albumin 3.9 3.5 - 5.0 g/dL   AST 18 15 - 41 U/L   ALT 17 0 - 44 U/L   Alkaline Phosphatase 77 38 - 126 U/L   Total Bilirubin 2.0 (H) 0.3 - 1.2 mg/dL   GFR calc non Af Amer >60 >60 mL/min   GFR calc Af Amer >60 >60 mL/min   Anion gap 13 5 - 15    Comment: Performed at [facility], [address] [place], [place] [address]  CBC with Differential     Status: Abnormal   Collection Time: 01/21/19  3:19 AM  Result Value Ref Range   WBC 24.6 (H) 4.0 - 10.5 K/uL   RBC 4.40 3.87 - 5.11 MIL/uL   Hemoglobin 13.2 12.0 - 15.0 g/dL   HCT 19.139.2 47.836.0 - 29.546.0 %   MCV 89.1 80.0 - 100.0 fL   MCH 30.0 26.0 - 34.0 pg   MCHC 33.7 30.0 -  36.0 g/dL   RDW 62.112.7 30.811.5 - 65.715.5 %   Platelets 302 150 - 400 K/uL   nRBC 0.0 0.0 - 0.2 %   Neutrophils Relative % 89 %   Neutro Abs 22.1 (H) 1.7 - 7.7 K/uL   Lymphocytes Relative 5 %   Lymphs Abs 1.1 0.7 - 4.0 K/uL   Monocytes Relative 5 %   Monocytes Absolute 1.1 (H) 0.1 - 1.0 K/uL   Eosinophils Relative 0 %   Eosinophils Absolute 0.0 0.0 - 0.5 K/uL   Basophils Relative 0 %   Basophils Absolute 0.1 0.0 - 0.1 K/uL   Immature Granulocytes 1 %   Abs Immature Granulocytes 0.19 (H) 0.00 - 0.07 K/uL    Comment: Performed at [facility], [address] [place], [place] [address]  Blood Culture (routine x 2)     Status: None (Preliminary result)   Collection Time: 01/21/19  3:19 AM  Result Value Ref Range   Specimen Description BLOOD RIGHT ASSIST CONTROL    Special Requests      BOTTLES DRAWN AEROBIC AND ANAEROBIC Blood Culture adequate volume   Culture      NO GROWTH <12 HOURS Performed at [facility], [address] [place], [place] [address]    Report Status PENDING   Blood Culture (routine x 2)     Status: None (Preliminary result)   Collection Time: 01/21/19  3:19 AM  Result Value Ref Range   Specimen Description BLOOD LEFT ASSIST CONTROL    Special Requests      BOTTLES DRAWN AEROBIC AND ANAEROBIC Blood Culture adequate volume   Culture      NO GROWTH <12 HOURS Performed at [facility], [address] [place], [place] [address]    Report Status PENDING   SARS Coronavirus 2 (CEPHEID - Performed in [facility] hospital lab), Hosp Order     Status: None   Collection Time: 01/21/19  3:19 AM  Result Value Ref Range   SARS Coronavirus 2 NEGATIVE NEGATIVE    Comment: (NOTE) If result is NEGATIVE SARS-CoV-2 target nucleic acids are NOT DETECTED. The SARS-CoV-2 RNA is generally detectable in upper and lower  respiratory specimens during the acute phase of infection. The lowest  concentration of SARS-CoV-2 viral copies this assay can detect is  250  copies / mL. A negative result does not preclude SARS-CoV-2 infection  and should not be used as the sole basis for treatment or other  patient management decisions.  A negative result may occur with  improper specimen collection / handling, submission of specimen other  than nasopharyngeal swab, presence of viral mutation(s) within the  areas targeted by this assay, and inadequate number of viral copies  (<250 copies / mL). A negative result must be combined with clinical  observations, patient history, and epidemiological information. If result is POSITIVE SARS-CoV-2 target nucleic acids are DETECTED. The SARS-CoV-2 RNA is generally detectable in upper and lower  respiratory specimens dur ing the acute phase of infection.  Positive  results are indicative of active infection with SARS-CoV-2.  Clinical  correlation with patient history and other diagnostic information is  necessary to determine patient infection status.  Positive results do  not rule out bacterial infection or co-infection with other viruses. If result is PRESUMPTIVE POSTIVE SARS-CoV-2 nucleic acids MAY BE PRESENT.   A presumptive positive result was obtained on the submitted specimen  and confirmed on repeat testing.  While 2019 novel coronavirus  (SARS-CoV-2) nucleic acids may be present in the submitted sample  additional confirmatory testing may be necessary for epidemiological  and / or clinical management purposes  to differentiate between  SARS-CoV-2 and other Sarbecovirus currently known to infect humans.  If clinically indicated additional testing with an alternate test  methodology [phone]) is advised. The SARS-CoV-2 RNA is generally  detectable in upper and lower respiratory sp ecimens during the acute  phase of infection. The expected result is Negative. Fact Sheet for Patients:  BoilerBrush.com.cy Fact Sheet for Healthcare  Providers: https://pope.com/ This test is not yet approved or cleared by the [place] FDA and has been authorized for detection and/or diagnosis of SARS-CoV-2 by FDA under an Emergency Use Authorization (EUA).  This EUA will remain in effect (meaning this test can be used) for the duration of the COVID-19 declaration under Section 564(b)(1) of the Act, 21 U.S.C. section 360bbb-3(b)(1), unless the authorization is terminated or revoked sooner. Performed at [facility], [address] [place], [place] [address]   Urinalysis, Complete w Microscopic     Status: Abnormal   Collection Time: 01/21/19  3:56 AM  Result Value Ref Range   Color, Urine YELLOW (A) YELLOW   APPearance HAZY (A) CLEAR   Specific Gravity, Urine 1.005 1.005 - 1.030   pH 7.0 5.0 - 8.0   Glucose, UA NEGATIVE NEGATIVE mg/dL   Hgb urine dipstick MODERATE (A) NEGATIVE   Bilirubin Urine NEGATIVE NEGATIVE   Ketones, ur NEGATIVE NEGATIVE mg/dL   Protein, ur 30 (A) NEGATIVE mg/dL   Nitrite NEGATIVE NEGATIVE   Leukocytes,Ua LARGE (A) NEGATIVE   RBC / HPF 6-10 0 - 5 RBC/hpf   WBC, UA >50 (H) 0 - 5 WBC/hpf   Bacteria, UA NONE SEEN NONE SEEN   Squamous Epithelial / LPF 0-5 0 - 5   WBC Clumps PRESENT     Comment: Performed at [facility], [address] [place], [place] [address]   Dg Chest Port 1 View  Result Date: 01/21/2019 CLINICAL DATA:  Fever, chills EXAM: PORTABLE CHEST 1 VIEW COMPARISON:  None. FINDINGS: Heart and mediastinal contours are within normal limits. No focal opacities or effusions. No acute bony abnormality. IMPRESSION: No active disease. Electronically Signed   By: Caryn Bee  Dover M.D.   On: 01/21/2019 03:27    Pending Labs Unresulted Labs (From admission, onward)    Start     Ordered   01/28/19 0500  Creatinine, serum  (enoxaparin (LOVENOX)    CrCl >/= 30 ml/min)  Weekly,   STAT    Comments:  while on enoxaparin therapy    01/21/19 0513   01/22/19  0500  Protime-INR  Tomorrow morning,   STAT     01/21/19 0513   01/22/19 0500  Cortisol-am, blood  Tomorrow morning,   STAT     01/21/19 0513   01/22/19 0500  Procalcitonin  Tomorrow morning,   STAT     01/21/19 0513   01/22/19 0500  Basic metabolic panel  Tomorrow morning,   STAT     01/21/19 0513   01/22/19 0500  CBC  Tomorrow morning,   STAT     01/21/19 0513   01/21/19 0510  HIV antibody (Routine Testing)  Once,   STAT     01/21/19 0513   01/21/19 0510  CBC  (enoxaparin (LOVENOX)    CrCl >/= 30 ml/min)  Once,   STAT    Comments:  Baseline for enoxaparin therapy IF NOT ALREADY DRAWN.  Notify MD if PLT < 100 K.    01/21/19 0513   01/21/19 0508  Culture, blood (x 2)  BLOOD CULTURE X 2,   STAT    Comments:  INITIATE ANTIBIOTICS WITHIN 1 HOUR AFTER BLOOD CULTURES DRAWN.  If unable to obtain blood cultures, call MD immediately regarding antibiotic instructions.    01/21/19 0508   01/21/19 0508  CBC with Differential  ONCE - STAT,   STAT     01/21/19 0508   01/21/19 0508  Comprehensive metabolic panel  ONCE - STAT,   STAT     01/21/19 0508   01/21/19 0508  Lactic acid, plasma  STAT Now then every 3 hours,   STAT     01/21/19 0508   01/21/19 0508  Procalcitonin  ONCE - STAT,   STAT     01/21/19 0508   01/21/19 0508  Protime-INR  ONCE - STAT,   STAT     01/21/19 0508   01/21/19 0508  APTT  ONCE - STAT,   STAT     01/21/19 0508   01/21/19 0406  Pregnancy, urine  ONCE - STAT,   STAT     01/21/19 0406   01/21/19 0301  Lactic acid, plasma  Now then every 2 hours,   STAT     01/21/19 0301   01/21/19 0300  Urine culture  ONCE - STAT,   STAT     01/21/19 0301          Vitals/Pain Today's Vitals   01/21/19 0330 01/21/19 0349 01/21/19 0400 01/21/19 0616  BP: 119/87  130/88   Pulse: (!) 139 (!) 127 (!) 124   Resp: (!) 29 (!) 25 13   Temp:      TempSrc:      SpO2: 98% 98% 99%   Weight:    67 kg  PainSc:        Isolation Precautions No active  isolations  Medications Medications  enoxaparin (LOVENOX) injection 40 mg (has no administration in time range)  0.9 %  sodium chloride infusion (has no administration in time range)  cefTRIAXone (ROCEPHIN) 1 g in sodium chloride 0.9 % 100 mL IVPB (has no administration in time range)  acetaminophen (TYLENOL) tablet 650 mg (has no administration in  time range)    Or  acetaminophen (TYLENOL) suppository 650 mg (has no administration in time range)  polyethylene glycol (MIRALAX / GLYCOLAX) packet 17 g (has no administration in time range)  ondansetron (ZOFRAN) tablet 4 mg (has no administration in time range)    Or  ondansetron (ZOFRAN) injection 4 mg (has no administration in time range)  multivitamin with minerals tablet 1 tablet (has no administration in time range)  potassium chloride (KLOR-CON) packet 20 mEq (has no administration in time range)  0.9 %  sodium chloride infusion ( Intravenous Stopped 01/21/19 0549)  0.9 %  sodium chloride infusion ( Intravenous Stopped 01/21/19 0550)  vancomycin (VANCOCIN) IVPB 1000 mg/200 mL premix (0 mg Intravenous Stopped 01/21/19 0549)  piperacillin-tazobactam (ZOSYN) IVPB 3.375 g (0 g Intravenous Stopped 01/21/19 0406)    Mobility walks Low fall risk   Focused Assessments Cardiac Assessment Handoff:    No results found for: CKTOTAL, CKMB, CKMBINDEX, TROPONINI No results found for: DDIMER Does the Patient currently have chest pain? No      R Recommendations: See Admitting Provider Note  Report given to:   Additional Notes:  Pt requires interpreter. Pt primary language spanish.

## 2019-01-21 NOTE — Progress Notes (Signed)
PHARMACY - PHYSICIAN COMMUNICATION CRITICAL VALUE ALERT - BLOOD CULTURE IDENTIFICATION (BCID)  Karla Powers is an 43 y.o. female who presented to [facility] on 01/21/2019 with a chief complaint of UTI  Assessment:  3/4 bottles(2 aerobic,1 anaerobic) grew Positive for E. Coli and Enterobacter Spp   Name of physician (or Provider) Contacted: Gouru  Current antibiotics: Ceftriaxone  Changes to prescribed antibiotics recommended:  Recommendations accepted by provider. Increased Ceftriaxone from 1g to 2g Q24 hours.  Results for orders placed or performed during the hospital encounter of 01/21/19  Blood Culture ID Panel (Reflexed) (Collected: 01/21/2019  3:19 AM)  Result Value Ref Range   Enterococcus species NOT DETECTED NOT DETECTED   Listeria monocytogenes NOT DETECTED NOT DETECTED   Staphylococcus species NOT DETECTED NOT DETECTED   Staphylococcus aureus (BCID) NOT DETECTED NOT DETECTED   Streptococcus species NOT DETECTED NOT DETECTED   Streptococcus agalactiae NOT DETECTED NOT DETECTED   Streptococcus pneumoniae NOT DETECTED NOT DETECTED   Streptococcus pyogenes NOT DETECTED NOT DETECTED   Acinetobacter baumannii NOT DETECTED NOT DETECTED   Enterobacteriaceae species DETECTED (A) NOT DETECTED   Enterobacter cloacae complex NOT DETECTED NOT DETECTED   Escherichia coli DETECTED (A) NOT DETECTED   Klebsiella oxytoca NOT DETECTED NOT DETECTED   Klebsiella pneumoniae NOT DETECTED NOT DETECTED   Proteus species NOT DETECTED NOT DETECTED   Serratia marcescens NOT DETECTED NOT DETECTED   Carbapenem resistance NOT DETECTED NOT DETECTED   Haemophilus influenzae NOT DETECTED NOT DETECTED   Neisseria meningitidis NOT DETECTED NOT DETECTED   Pseudomonas aeruginosa NOT DETECTED NOT DETECTED   Candida albicans NOT DETECTED NOT DETECTED   Candida glabrata NOT DETECTED NOT DETECTED   Candida krusei NOT DETECTED NOT DETECTED   Candida parapsilosis NOT DETECTED NOT DETECTED   Candida  tropicalis NOT DETECTED NOT DETECTED    [name] 01/21/2019  5:57 PM

## 2019-01-21 NOTE — Progress Notes (Signed)
Notified Dr Amado Coe that pt's HR in the 130's ST on admission. MD to order 1L bolus.

## 2019-01-22 LAB — BASIC METABOLIC PANEL
Anion gap: 6 (ref 5–15)
BUN: 7 mg/dL (ref 6–20)
CO2: 20 mmol/L — ABNORMAL LOW (ref 22–32)
Calcium: 7.4 mg/dL — ABNORMAL LOW (ref 8.9–10.3)
Chloride: 110 mmol/L (ref 98–111)
Creatinine, Ser: 0.58 mg/dL (ref 0.44–1.00)
GFR calc Af Amer: >60 mL/min (ref >60)
GFR calc non Af Amer: >60 mL/min (ref >60)
Glucose, Bld: 122 mg/dL — ABNORMAL HIGH (ref 70–99)
Potassium: 3 mmol/L — ABNORMAL LOW (ref 3.5–5.1)
Sodium: 136 mmol/L (ref 135–145)

## 2019-01-22 LAB — URINE CULTURE

## 2019-01-22 LAB — CBC
HCT: 31.8 % — ABNORMAL LOW (ref 36.0–46.0)
Hemoglobin: 10.4 g/dL — ABNORMAL LOW (ref 12.0–15.0)
MCH: 30 pg (ref 26.0–34.0)
MCHC: 32.7 g/dL (ref 30.0–36.0)
MCV: 91.6 fL (ref 80.0–100.0)
Platelets: 232 10*3/uL (ref 150–400)
RBC: 3.47 MIL/uL — ABNORMAL LOW (ref 3.87–5.11)
RDW: 13.1 % (ref 11.5–15.5)
WBC: 18.6 10*3/uL — ABNORMAL HIGH (ref 4.0–10.5)
nRBC: 0 % (ref 0.0–0.2)

## 2019-01-22 LAB — CORTISOL-AM, BLOOD: Cortisol - AM: 24.1 ug/dL — ABNORMAL HIGH (ref 6.7–22.6)

## 2019-01-22 LAB — PROTIME-INR
INR: 1.3 — ABNORMAL HIGH (ref 0.8–1.2)
Prothrombin Time: 15.6 seconds — ABNORMAL HIGH (ref 11.4–15.2)

## 2019-01-22 LAB — PROCALCITONIN: Procalcitonin: 0.52 ng/mL

## 2019-01-22 LAB — HIV ANTIBODY (ROUTINE TESTING W REFLEX): HIV Screen 4th Generation wRfx: NONREACTIVE

## 2019-01-22 MED ORDER — POTASSIUM CHLORIDE CRYS ER 20 MEQ PO TBCR
40.0000 meq | EXTENDED_RELEASE_TABLET | ORAL | Status: AC
  Administered 2019-01-22 (×2): 40 meq via ORAL
  Filled 2019-01-22 (×2): qty 2

## 2019-01-22 MED ORDER — METOPROLOL TARTRATE 25 MG PO TABS
12.5000 mg | ORAL_TABLET | Freq: Two times a day (BID) | ORAL | Status: DC
  Administered 2019-01-22 – 2019-01-23 (×3): 12.5 mg via ORAL
  Filled 2019-01-22 (×2): qty 1

## 2019-01-22 NOTE — Progress Notes (Signed)
[facility] [place] [facility]   PATIENT NAME: Karla CornMiriam Osorio Powers    MR#:  [id number]  DATE OF BIRTH:  December 16, 1975  SUBJECTIVE:  CHIEF COMPLAINT: Patient is resting comfortably.  Denies any nausea vomiting.  Left abdominal and flank pain getting better.  Tolerating diet   REVIEW OF SYSTEMS:  CONSTITUTIONAL: No fever, fatigue or weakness.  EYES: No blurred or double vision.  EARS, NOSE, AND THROAT: No tinnitus or ear pain.  RESPIRATORY: No cough, shortness of breath, wheezing or hemoptysis.  CARDIOVASCULAR: No chest pain, orthopnea, edema.  GASTROINTESTINAL: No nausea, vomiting, diarrhea or abdominal pain.  GENITOURINARY: No dysuria, hematuria.  ENDOCRINE: No polyuria, nocturia,  HEMATOLOGY: No anemia, easy bruising or bleeding SKIN: No rash or lesion. MUSCULOSKELETAL: No joint pain or arthritis.   NEUROLOGIC: No tingling, numbness, weakness.  PSYCHIATRY: No anxiety or depression.   DRUG ALLERGIES:  No Known Allergies  VITALS:  Blood pressure (!) 137/91, pulse (!) 132, temperature (!) 103.1 F (39.5 C), temperature source Oral, resp. rate 20, height 5\' 1"  (1.549 m), weight 67 kg, last menstrual period 01/16/2019, SpO2 100 %, not currently breastfeeding.  PHYSICAL EXAMINATION:  GENERAL:  43 y.o.-year-old patient lying in the bed with no acute distress.  EYES: Pupils equal, round, reactive to light and accommodation. No scleral icterus. Extraocular muscles intact.  HEENT: Head atraumatic, normocephalic. Oropharynx and nasopharynx clear.  NECK:  Supple, no jugular venous distention. No thyroid enlargement, no tenderness.  LUNGS: Normal breath sounds bilaterally, no wheezing, rales,rhonchi or crepitation. No use of accessory muscles of respiration.  CARDIOVASCULAR: S1, S2 normal. No murmurs, rubs, or gallops.  ABDOMEN: Soft, nontender, nondistended. Bowel sounds present.  EXTREMITIES: No pedal edema, cyanosis, or clubbing.  NEUROLOGIC: Cranial nerves  II through XII are intact. Muscle strength 5/5 in all extremities. Sensation intact. Gait not checked.  PSYCHIATRIC: The patient is alert and oriented x 3.  SKIN: No obvious rash, lesion, or ulcer.    LABORATORY PANEL:   CBC Recent Labs  Lab 01/22/19 0505  WBC 18.6*  HGB 10.4*  HCT 31.8*  PLT 232   ------------------------------------------------------------------------------------------------------------------  Chemistries  Recent Labs  Lab 01/21/19 0319 01/22/19 0505  NA 138 136  K 3.1* 3.0*  CL 105 110  CO2 20* 20*  GLUCOSE 153* 122*  BUN 10 7  CREATININE 0.76 0.58  CALCIUM 8.8* 7.4*  AST 18  --   ALT 17  --   ALKPHOS 77  --   BILITOT 2.0*  --    ------------------------------------------------------------------------------------------------------------------  Cardiac Enzymes No results for input(s): TROPONINI in the last 168 hours. ------------------------------------------------------------------------------------------------------------------  RADIOLOGY:  Dg Chest Port 1 View  Result Date: 01/21/2019 CLINICAL DATA:  Fever, chills EXAM: PORTABLE CHEST 1 VIEW COMPARISON:  None. FINDINGS: Heart and mediastinal contours are within normal limits. No focal opacities or effusions. No acute bony abnormality. IMPRESSION: No active disease. Electronically Signed   By: Charlett NoseKevin  Dover M.D.   On: 01/21/2019 03:27    EKG:   Orders placed or performed during the hospital encounter of 01/21/19  . ED EKG 12-Lead  . ED EKG 12-Lead  . EKG 12-Lead  . EKG 12-Lead  . EKG 12-Lead  . EKG 12-Lead    ASSESSMENT AND PLAN:   1.  Sepsis - Likely urosepsis, but 1 set of blood cultures are also with positive growth E. coli  patient received 2 L normal saline bolus currently with normal saline infusing at 125 cc/h through peripheral IV.  Initial lactic acid is 1.8.    Will trend lactic acid level -Procalcitonin 0.80-0.52 -Leukocytosis trending down - We will continue to monitor  heart rate closely although this is likely secondary to infectious process.  Patient will be on telemetry monitoring - Blood and urine cultures are pending.  Will review results and adjust therapy accordingly  2.  Urinary tract infection - Treated with IV Rocephin initially in the emergency room and this has been continued. - As previously stated, urine cultures are pending.  Will adjust treatment expectantly when results are available for review.  3. hyperglycemia - We will get hemoglobin A1c as patient has a history of gestational diabetes - We will continue to monitor glucose levels.  4. hypokalemia - Replete and recheck in a.m.  Check magnesium level also - Patient will have telemetry monitoring for possible arrhythmias  5.  Sinus tachycardia Patient is asymptomatic Continue to hydrate with IV fluids If no improvement will consider adding small dose beta-blocker     All the records are reviewed and case discussed with Care Management/Social Workerr. Management plans discussed with the patient, with the help of Spanish interpreter Potter and they are in agreement.  CODE STATUS:   TOTAL TIME TAKING CARE OF THIS PATIENT: 36  minutes.   POSSIBLE D/C IN 1-2  DAYS, DEPENDING ON CLINICAL CONDITION.  Note: This dictation was prepared with Dragon dictation along with smaller phrase technology. Any transcriptional errors that result from this process are unintentional.   Ramonita Lab M.D on 01/22/2019 at 2:33 PM  Between 7am to 6pm - Pager - [phone] After 6pm go to [email or web address] - password [email or web address]  [facility]  Office  [phone]  CC: Primary care physician; Department, [facility]

## 2019-01-23 LAB — CBC
HCT: 33.2 % — ABNORMAL LOW (ref 36.0–46.0)
Hemoglobin: 11 g/dL — ABNORMAL LOW (ref 12.0–15.0)
MCH: 30.1 pg (ref 26.0–34.0)
MCHC: 33.1 g/dL (ref 30.0–36.0)
MCV: 91 fL (ref 80.0–100.0)
Platelets: 239 10*3/uL (ref 150–400)
RBC: 3.65 MIL/uL — ABNORMAL LOW (ref 3.87–5.11)
RDW: 13.1 % (ref 11.5–15.5)
WBC: 8.4 10*3/uL (ref 4.0–10.5)
nRBC: 0 % (ref 0.0–0.2)

## 2019-01-23 LAB — BASIC METABOLIC PANEL
Anion gap: 5 (ref 5–15)
BUN: 6 mg/dL (ref 6–20)
CO2: 21 mmol/L — ABNORMAL LOW (ref 22–32)
Calcium: 7.8 mg/dL — ABNORMAL LOW (ref 8.9–10.3)
Chloride: 111 mmol/L (ref 98–111)
Creatinine, Ser: 0.58 mg/dL (ref 0.44–1.00)
GFR calc Af Amer: >60 mL/min (ref >60)
GFR calc non Af Amer: >60 mL/min (ref >60)
Glucose, Bld: 124 mg/dL — ABNORMAL HIGH (ref 70–99)
Potassium: 3.8 mmol/L (ref 3.5–5.1)
Sodium: 137 mmol/L (ref 135–145)

## 2019-01-23 LAB — PROCALCITONIN: Procalcitonin: 0.31 ng/mL

## 2019-01-23 LAB — CULTURE, BLOOD (ROUTINE X 2)
Special Requests: ADEQUATE
Special Requests: ADEQUATE

## 2019-01-23 LAB — LACTIC ACID, PLASMA
Lactic Acid, Venous: 1.4 mmol/L (ref 0.5–1.9)
Lactic Acid, Venous: 0.8 mmol/L (ref 0.5–1.9)

## 2019-01-23 LAB — HEMOGLOBIN A1C
Hgb A1c MFr Bld: 5.5 % (ref 4.8–5.6)
Mean Plasma Glucose: 111.15 mg/dL

## 2019-01-23 LAB — TSH: TSH: 4.693 u[IU]/mL — ABNORMAL HIGH (ref 0.350–4.500)

## 2019-01-23 LAB — MAGNESIUM: Magnesium: 2.2 mg/dL (ref 1.7–2.4)

## 2019-01-23 MED ORDER — AMOXICILLIN-POT CLAVULANATE 875-125 MG PO TABS
1.0000 | ORAL_TABLET | Freq: Two times a day (BID) | ORAL | Status: DC
  Administered 2019-01-23: 13:00:00 1 via ORAL
  Filled 2019-01-23: qty 1

## 2019-01-23 MED ORDER — METOPROLOL TARTRATE 25 MG PO TABS: 25.0000 mg | ORAL_TABLET | Freq: Two times a day (BID) | ORAL | 0 refills | Status: AC

## 2019-01-23 MED ORDER — METOPROLOL TARTRATE 25 MG PO TABS: 12.5000 mg | ORAL_TABLET | Freq: Two times a day (BID) | ORAL | 0 refills | Status: DC

## 2019-01-23 MED ORDER — AMOXICILLIN-POT CLAVULANATE 875-125 MG PO TABS: 1.0000 | ORAL_TABLET | Freq: Two times a day (BID) | ORAL | 0 refills | Status: DC

## 2019-01-23 MED ORDER — CIPROFLOXACIN HCL 500 MG PO TABS: 500.0000 mg | ORAL_TABLET | Freq: Two times a day (BID) | ORAL | 0 refills | Status: AC

## 2019-01-23 MED ORDER — METOPROLOL TARTRATE 25 MG PO TABS
25.0000 mg | ORAL_TABLET | Freq: Two times a day (BID) | ORAL | Status: DC
  Administered 2019-01-23: 25 mg via ORAL
  Filled 2019-01-23: qty 1

## 2019-01-23 MED ORDER — ACETAMINOPHEN 325 MG PO TABS: 650.0000 mg | ORAL_TABLET | Freq: Four times a day (QID) | ORAL | Status: AC | PRN

## 2019-01-23 NOTE — Progress Notes (Signed)
RN called for blood pressure at 156/105 and heart rate 118 Metoprolol increased to 25 mg p.o. twice daily.  First dose to be given now

## 2019-01-23 NOTE — Progress Notes (Signed)
MD notified: Would you like to order any PRN blood pressure medicine since his BP is 156/105 HR is 118, temp obtained orally 99.6 and temp axillary 97.5

## 2019-01-23 NOTE — Progress Notes (Signed)
MD has called [facility] pharmacy where she has sent electronic medication prescriptions for metoprolol 25mg  (1tablet) two times a day and  Ciprofloxacin 1 tablet twice a day. RN has called [facility] and they indicate they have have received the order.

## 2019-01-23 NOTE — Discharge Summary (Addendum)
[facility] - [place] at [facility]   PATIENT NAME: Karla Powers    MR#:  [id number]  DATE OF BIRTH:  1975-10-11  DATE OF ADMISSION:  01/21/2019 ADMITTING PHYSICIAN: Hannah Beat, MD  DATE OF DISCHARGE: 01/23/2019 PRIMARY CARE PHYSICIAN: Department, [facility]    ADMISSION DIAGNOSIS:  Lower urinary tract infectious disease [N39.0] SIRS (systemic inflammatory response syndrome) (HCC) [R65.10] Sepsis (HCC) [A41.9]  DISCHARGE DIAGNOSIS:  Active Problems:   Sepsis (HCC) Acute cystitis  SECONDARY DIAGNOSIS:   Past Medical History:  Diagnosis Date  . Diabetes mellitus without complication (HCC)    with this pregnancy, diet control only    HOSPITAL COURSE:   HPI   Expand All Collapse All    Show:Clear all Manual[] Template[] Copied  Added by: Seals, Milas Kocher, NP  Hover for details   [employer] -  at [facility]   PATIENT NAME: Karla Powers    MR#:  [id number]  DATE OF BIRTH:  [birth year]  DATE OF ADMISSION:  01/21/2019  PRIMARY CARE PHYSICIAN: Department, [facility]   REQUESTING/REFERRING PHYSICIAN: Daryel November, MD  CHIEF COMPLAINT:      Chief Complaint  Patient presents with  . Fever  . Dysuria    HISTORY OF PRESENT ILLNESS:  Karla Powers  is a 43 y.o. female with a known history of gestational diabetes.  She presented to the emergency room complaining of fever with chills and dysuria which have become worse over the last 24 hours.  Patient reports a 2-week history of intermittent fevers and chills as well as dysuria and urine frequency with urgency.  Over the last 7 days she has been taking over-the-counter Pyridium with no relief in symptoms.  She denies having had prior antibiotic treatment for current illness.  She denies abdominal pain.  She denies vomiting however she has noticed occasional periods of nausea.  She denies hematuria.  She  denies diarrhea.  She denies chest pain or shortness of breath.  She denies cough.  Labs on arrival demonstrate leukocytosis with WBC 24.6.  Also hypokalemia with potassium 3.1.  Urinalysis demonstrates large leukocytes.  Lactic acid is 1.8.  Patient is tachycardic on arrival with heart rate in the 150s with fever of 103.0.  Urine culture is pending.  She has been admitted to the hospitalist service for further evaluation and management.      Percell [place] UTI and ecoli bacteremia - patient received 2 L normal saline bolus and continued with normal saline infusing at 125 cc/h through peripheral IV during the hospital course  Initial lactic acid is 1.8.--1.4 -Procalcitonin 0.80-0.52-0.31 -Leukocytosis resolved -- Blood cultures with ecoli  and urine culture with multiple species which is contaminated  As leukocytosis resolved and procalcitonin trended down we will discharge patient with cipro p.o.for 5 days, appreciate pharm recommendations  2.Urinary tract infection - Treated with IV Rocephin initially in the emergency room and this has been continued. -  Urine culture with multiple species contaminated will discharge with p.o.cipro for 5 days , augmentin dced and called in her pharmacy cipro  3.hyperglycemia - We will get OP hemoglobin A1c as patient has a history of gestational diabetes  4.hypokalemia-resolved with the supplements Magnesium at 2.2  5.  New HTN with Sinus tachycardia Patient is asymptomatic Hydrated with IV fluids but still being tachycardic ,25 mg of  beta-blocker is added to the regimen.  Patient is agreeable Life style modifications recommended  Plan of care discussed with the patient  the help of Spanish interpreter DISCHARGE CONDITIONS:   Stable  CONSULTS OBTAINED:     PROCEDURES none  DRUG ALLERGIES:  No Known Allergies  DISCHARGE MEDICATIONS:   Allergies as of 01/23/2019   No Known Allergies     Medication List    STOP taking these  medications   ibuprofen 600 MG tablet Commonly known as:  ADVIL     TAKE these medications   acetaminophen 325 MG tablet Commonly known as:  TYLENOL Take 2 tablets (650 mg total) by mouth every 6 (six) hours as needed for mild pain (or Fever >/= 101).   ciprofloxacin 500 MG tablet Commonly known as:  Cipro Take 1 tablet (500 mg total) by mouth 2 (two) times daily for 10 doses.   ferrous sulfate 325 (65 FE) MG tablet Take 1 tablet (325 mg total) by mouth 2 (two) times daily with a meal.   metoprolol tartrate 25 MG tablet Commonly known as:  LOPRESSOR Take 1 tablet (25 mg total) by mouth 2 (two) times daily.   metoprolol tartrate 25 MG tablet Commonly known as:  LOPRESSOR Take 1 tablet (25 mg total) by mouth 2 (two) times daily.   multivitamin-prenatal 27-0.8 MG Tabs tablet Take 1 tablet by mouth daily at 12 noon.        DISCHARGE INSTRUCTIONS:   Follow-up with primary care physician in 3 days  DIET:  Diabetic diet  DISCHARGE CONDITION:  Stable  ACTIVITY:  Activity as tolerated  OXYGEN:  Home Oxygen: No.   Oxygen Delivery: room air  DISCHARGE LOCATION:  home   If you experience worsening of your admission symptoms, develop shortness of breath, life threatening emergency, suicidal or homicidal thoughts you must seek medical attention immediately by calling 911 or calling your MD immediately  if symptoms less severe.  You Must read complete instructions/literature along with all the possible adverse reactions/side effects for all the Medicines you take and that have been prescribed to you. Take any new Medicines after you have completely understood and accpet all the possible adverse reactions/side effects.   Please note  You were cared for by a hospitalist during your hospital stay. If you have any questions about your discharge medications or the care you received while you were in the hospital after you are discharged, you can call the unit and asked to speak  with the hospitalist on call if the hospitalist that took care of you is not available. Once you are discharged, your primary care physician will handle any further medical issues. Please note that NO REFILLS for any discharge medications will be authorized once you are discharged, as it is imperative that you return to your primary care physician (or establish a relationship with a primary care physician if you do not have one) for your aftercare needs so that they can reassess your need for medications and monitor your lab values.     Today  Chief Complaint  Patient presents with  . Fever  . Dysuria   Patient is doing fine denies any complaints.  No nausea vomiting or abdominal pain.  Dysuria resolved wants to go home  ROS:  CONSTITUTIONAL: Denies fevers, chills. Denies any fatigue, weakness.  EYES: Denies blurry vision, double vision, eye pain. EARS, NOSE, THROAT: Denies tinnitus, ear pain, hearing loss. RESPIRATORY: Denies cough, wheeze, shortness of breath.  CARDIOVASCULAR: Denies chest pain, palpitations, edema.  GASTROINTESTINAL: Denies nausea, vomiting, diarrhea, abdominal pain. Denies bright red blood per rectum. GENITOURINARY: Denies dysuria, hematuria. ENDOCRINE: Denies nocturia  or thyroid problems. HEMATOLOGIC AND LYMPHATIC: Denies easy bruising or bleeding. SKIN: Denies rash or lesion. MUSCULOSKELETAL: Denies pain in neck, back, shoulder, knees, hips or arthritic symptoms.  NEUROLOGIC: Denies paralysis, paresthesias.  PSYCHIATRIC: Denies anxiety or depressive symptoms.   VITAL SIGNS:  Blood pressure (!) 140/100, pulse 98, temperature 98.7 F (37.1 C), temperature source Oral, resp. rate 18, height  (1.549 m), weight 67 kg, last menstrual period 01/16/2019, SpO2 100 %, not currently breastfeeding.  I/O:    Intake/Output Summary (Last 24 hours) at 01/23/2019 1608 Last data filed at 01/23/2019 1239 Gross per 24 hour  Intake 2660.23 ml  Output 1600 ml  Net 1060.23  ml    PHYSICAL EXAMINATION:  GENERAL:  43 y.o.-year-old patient lying in the bed with no acute distress.  EYES: Pupils equal, round, reactive to light and accommodation. No scleral icterus. Extraocular muscles intact.  HEENT: Head atraumatic, normocephalic. Oropharynx and nasopharynx clear.  NECK:  Supple, no jugular venous distention. No thyroid enlargement, no tenderness.  LUNGS: Normal breath sounds bilaterally, no wheezing, rales,rhonchi or crepitation. No use of accessory muscles of respiration.  CARDIOVASCULAR: S1, S2 normal. No murmurs, rubs, or gallops.  ABDOMEN: Soft, non-tender, non-distended. Bowel sounds present. EXTREMITIES: No pedal edema, cyanosis, or clubbing.  NEUROLOGIC: Cranial nerves II through XII are intact. Muscle strength 5/5 in all extremities. Sensation intact. Gait not checked.  PSYCHIATRIC: The patient is alert and oriented x 3.  SKIN: No obvious rash, lesion, or ulcer.   DATA REVIEW:   CBC Recent Labs  Lab 01/23/19 0915  WBC 8.4  HGB 11.0*  HCT 33.2*  PLT 239    Chemistries  Recent Labs  Lab 01/21/19 0319  01/23/19 0531  NA 138   < > 137  K 3.1*   < > 3.8  CL 105   < > 111  CO2 20*   < > 21*  GLUCOSE 153*   < > 124*  BUN 10   < > 6  CREATININE 0.76   < > 0.58  CALCIUM 8.8*   < > 7.8*  MG  --   --  2.2  AST 18  --   --   ALT 17  --   --   ALKPHOS 77  --   --   BILITOT 2.0*  --   --    < > = values in this interval not displayed.    Cardiac Enzymes No results for input(s): TROPONINI in the last 168 hours.  Microbiology Results  Results for orders placed or performed during the hospital encounter of 01/21/19  Blood Culture (routine x 2)     Status: Abnormal   Collection Time: 01/21/19  3:19 AM  Result Value Ref Range Status   Specimen Description   Final    BLOOD RIGHT ANTECUBITAL Performed at [facility], [address] [place], [place] [address]    Special Requests   Final    BOTTLES DRAWN AEROBIC AND ANAEROBIC Blood  Culture adequate volume Performed at [facility], [address] [place], [place] [address]    Culture  Setup Time   Final    Organism ID to follow IN BOTH AEROBIC AND ANAEROBIC BOTTLES CRITICAL RESULT CALLED TO, READ BACK BY AND VERIFIED WITH: Mila Merry  01/21/19 MJU  GRAM NEGATIVE RODS Performed at [facility], [address] [place], [place] [address]    Culture (A)  Final    ESCHERICHIA COLI SUSCEPTIBILITIES PERFORMED ON PREVIOUS CULTURE  WITHIN THE LAST 5 DAYS. Performed at [facility], [address] [place], [place] [address]    Report Status 01/23/2019 FINAL  Final  Blood Culture (routine x 2)     Status: Abnormal   Collection Time: 01/21/19  3:19 AM  Result Value Ref Range Status   Specimen Description   Final    BLOOD LEFT ASSIST CONTROL Performed at [facility], [address] [place], [place] [address]    Special Requests   Final    BOTTLES DRAWN AEROBIC AND ANAEROBIC Blood Culture adequate volume Performed at [facility], [address] [place], [place] [address]    Culture  Setup Time   Final    AEROBIC BOTTLE ONLY GRAM NEGATIVE RODS CRITICAL RESULT CALLED TO, READ BACK BY AND VERIFIED WITH: WALID NAZARI @1615  01/21/2019 MJU Performed at [facility], [address] [place], [place] [address]    Culture ESCHERICHIA COLI (A)  Final   Report Status 01/23/2019 FINAL  Final   Organism ID, Bacteria ESCHERICHIA COLI  Final      Susceptibility   Escherichia coli - MIC*    AMPICILLIN >=32 RESISTANT Resistant     CEFAZOLIN 16 SENSITIVE Sensitive     CEFEPIME <=1 SENSITIVE Sensitive     CEFTAZIDIME <=1 SENSITIVE Sensitive     CEFTRIAXONE <=1 SENSITIVE Sensitive     CIPROFLOXACIN <=0.25 SENSITIVE Sensitive     GENTAMICIN <=1 SENSITIVE Sensitive     IMIPENEM <=0.25 SENSITIVE Sensitive     TRIMETH/SULFA >=320 RESISTANT Resistant     AMPICILLIN/SULBACTAM >=32 RESISTANT Resistant     PIP/TAZO 16  SENSITIVE Sensitive     Extended ESBL NEGATIVE Sensitive     * ESCHERICHIA COLI  SARS Coronavirus 2 (CEPHEID - Performed in [facility] hospital lab), Hosp Order     Status: None   Collection Time: 01/21/19  3:19 AM  Result Value Ref Range Status   SARS Coronavirus 2 NEGATIVE NEGATIVE Final    Comment: (NOTE) If result is NEGATIVE SARS-CoV-2 target nucleic acids are NOT DETECTED. The SARS-CoV-2 RNA is generally detectable in upper and lower  respiratory specimens during the acute phase of infection. The lowest  concentration of SARS-CoV-2 viral copies this assay can detect is 250  copies / mL. A negative result does not preclude SARS-CoV-2 infection  and should not be used as the sole basis for treatment or other  patient management decisions.  A negative result may occur with  improper specimen collection / handling, submission of specimen other  than nasopharyngeal swab, presence of viral mutation(s) within the  areas targeted by this assay, and inadequate number of viral copies  (<250 copies / mL). A negative result must be combined with clinical  observations, patient history, and epidemiological information. If result is POSITIVE SARS-CoV-2 target nucleic acids are DETECTED. The SARS-CoV-2 RNA is generally detectable in upper and lower  respiratory specimens dur ing the acute phase of infection.  Positive  results are indicative of active infection with SARS-CoV-2.  Clinical  correlation with patient history and other diagnostic information is  necessary to determine patient infection status.  Positive results do  not rule out bacterial infection or co-infection with other viruses. If result is PRESUMPTIVE POSTIVE SARS-CoV-2 nucleic acids MAY BE PRESENT.   A presumptive positive result was obtained on the submitted specimen  and confirmed on repeat testing.  While 2019 novel coronavirus  (SARS-CoV-2) nucleic acids may be present in the submitted sample  additional  confirmatory  testing may be necessary for epidemiological  and / or clinical management purposes  to differentiate between  SARS-CoV-2 and other Sarbecovirus currently known to infect humans.  If clinically indicated additional testing with an alternate test  methodology [id number]) is advised. The SARS-CoV-2 RNA is generally  detectable in upper and lower respiratory sp ecimens during the acute  phase of infection. The expected result is Negative. Fact Sheet for Patients:  BoilerBrush.com.cy Fact Sheet for Healthcare Providers: https://pope.com/ This test is not yet approved or cleared by the [place] FDA and has been authorized for detection and/or diagnosis of SARS-CoV-2 by FDA under an Emergency Use Authorization (EUA).  This EUA will remain in effect (meaning this test can be used) for the duration of the COVID-19 declaration under Section 564(b)(1) of the Act, 21 U.S.C. section 360bbb-3(b)(1), unless the authorization is terminated or revoked sooner. Performed at [facility], [address] [place], [place] [address]   Blood Culture ID Panel (Reflexed)     Status: Abnormal   Collection Time: 01/21/19  3:19 AM  Result Value Ref Range Status   Enterococcus species NOT DETECTED NOT DETECTED Final   Listeria monocytogenes NOT DETECTED NOT DETECTED Final   Staphylococcus species NOT DETECTED NOT DETECTED Final   Staphylococcus aureus (BCID) NOT DETECTED NOT DETECTED Final   Streptococcus species NOT DETECTED NOT DETECTED Final   Streptococcus agalactiae NOT DETECTED NOT DETECTED Final   Streptococcus pneumoniae NOT DETECTED NOT DETECTED Final   Streptococcus pyogenes NOT DETECTED NOT DETECTED Final   Acinetobacter baumannii NOT DETECTED NOT DETECTED Final   Enterobacteriaceae species DETECTED (A) NOT DETECTED Final    Comment: Enterobacteriaceae represent a large family of gram-negative bacteria, not a single organism. CRITICAL  RESULT CALLED TO, READ BACK BY AND VERIFIED WITH: WALID NAZARI  01/21/19 MJU     Enterobacter cloacae complex NOT DETECTED NOT DETECTED Final   Escherichia coli DETECTED (A) NOT DETECTED Final    Comment: CRITICAL RESULT CALLED TO, READ BACK BY AND VERIFIED WITH: WALID NAZARI  01/21/19 MJU    Klebsiella oxytoca NOT DETECTED NOT DETECTED Final   Klebsiella pneumoniae NOT DETECTED NOT DETECTED Final   Proteus species NOT DETECTED NOT DETECTED Final   Serratia marcescens NOT DETECTED NOT DETECTED Final   Carbapenem resistance NOT DETECTED NOT DETECTED Final   Haemophilus influenzae NOT DETECTED NOT DETECTED Final   Neisseria meningitidis NOT DETECTED NOT DETECTED Final   Pseudomonas aeruginosa NOT DETECTED NOT DETECTED Final   Candida albicans NOT DETECTED NOT DETECTED Final   Candida glabrata NOT DETECTED NOT DETECTED Final   Candida krusei NOT DETECTED NOT DETECTED Final   Candida parapsilosis NOT DETECTED NOT DETECTED Final   Candida tropicalis NOT DETECTED NOT DETECTED Final    Comment: Performed at [facility], [address] [place], [place] [address]  Urine culture     Status: Abnormal   Collection Time: 01/21/19  3:56 AM  Result Value Ref Range Status   Specimen Description   Final    URINE, CLEAN CATCH Performed at [facility], [address] [place], [place] [address]    Special Requests   Final    NONE Performed at [facility], [address] [place], [place] [address]    Culture MULTIPLE SPECIES PRESENT, SUGGEST RECOLLECTION (A)  Final   Report Status 01/22/2019 FINAL  Final  CULTURE, BLOOD (ROUTINE X 2) w Reflex to ID Panel     Status: None (Preliminary result)  Collection Time: 01/22/19  2:57 PM  Result Value Ref Range Status   Specimen Description BLOOD LEFT ANTECUBITAL  Final   Special Requests   Final    BOTTLES DRAWN AEROBIC AND ANAEROBIC Blood Culture adequate volume   Culture   Final    NO GROWTH < 24 HOURS  Performed at [facility], [address] [place], [place] [address]    Report Status PENDING  Incomplete  CULTURE, BLOOD (ROUTINE X 2) w Reflex to ID Panel     Status: None (Preliminary result)   Collection Time: 01/22/19  3:04 PM  Result Value Ref Range Status   Specimen Description BLOOD BLOOD LEFT HAND  Final   Special Requests   Final    BOTTLES DRAWN AEROBIC AND ANAEROBIC Blood Culture results may not be optimal due to an inadequate volume of blood received in culture bottles   Culture   Final    NO GROWTH < 24 HOURS Performed at [facility], [address] [place], [place] [address]    Report Status PENDING  Incomplete    RADIOLOGY:  Dg Chest Port 1 View  Result Date: 01/21/2019 CLINICAL DATA:  Fever, chills EXAM: PORTABLE CHEST 1 VIEW COMPARISON:  None. FINDINGS: Heart and mediastinal contours are within normal limits. No focal opacities or effusions. No acute bony abnormality. IMPRESSION: No active disease. Electronically Signed   By: Charlett NoseKevin  Dover M.D.   On: 01/21/2019 03:27    EKG:   Orders placed or performed during the hospital encounter of 01/21/19  . ED EKG 12-Lead  . ED EKG 12-Lead  . EKG 12-Lead  . EKG 12-Lead  . EKG 12-Lead  . EKG 12-Lead      Management plans discussed with the patient, with the help of Spanish interpreter Rafel she is  in agreement.  CODE STATUS:     Code Status Orders  (From admission, onward)         Start     Ordered   01/21/19 0511  Full code  Continuous     01/21/19 0513        Code Status History    Date Active Date Inactive Code Status Order ID Comments User Context   08/10/2016 0755 08/11/2016 1532 Full Code [id number]  Schermerhorn, Ihor Austinhomas J, MD Inpatient   08/08/2016 1436 08/10/2016 0755 Full Code [id number]  Ward, Elenora Fenderhelsea C, MD Inpatient   08/08/2016 1159 08/08/2016 1251 Full Code [id number]  Vennie Homansaylor, Michele A, RN Inpatient      TOTAL TIME TAKING CARE OF THIS PATIENT: 45 minutes.   Note:  This dictation was prepared with Dragon dictation along with smaller phrase technology. Any transcriptional errors that result from this process are unintentional.   @[facility] @  on 01/23/2019 at 4:08 PM  Between 7am to 6pm - Pager - [phone]  After 6pm go to [email or web address] - password [email or web address] [facility]  [facility]  Office  [phone]  CC: Primary care physician; Department, [facility]

## 2019-01-27 LAB — CULTURE, BLOOD (ROUTINE X 2)
Culture: NO GROWTH
Culture: NO GROWTH
Special Requests: ADEQUATE

## 2019-12-09 ENCOUNTER — Ambulatory Visit: Payer: Self-pay | Attending: Internal Medicine

## 2019-12-09 DIAGNOSIS — Z23 Encounter for immunization: Secondary | ICD-10-CM

## 2019-12-09 NOTE — Progress Notes (Signed)
   Covid-19 Vaccination Clinic  Name:  Rebel Laughridge    MRN: [id number] DOB: 07-20-1976  12/09/2019  Ms. Bently Morath was observed post Covid-19 immunization for 15 minutes without incident. She was provided with Vaccine Information Sheet and instruction to access the V-Safe system.   Ms. Jaquia Benedicto was instructed to call 911 with any severe reactions post vaccine: Marland Kitchen Difficulty breathing  . Swelling of face and throat  . A fast heartbeat  . A bad rash all over body  . Dizziness and weakness   Immunizations Administered    Name Date Dose VIS Date Route   Pfizer COVID-19 Vaccine 12/09/2019  4:55 PM 0.3 mL 08/26/2019 Intramuscular   Manufacturer: [employer], [employer]   Lot: [id number]   NDC: [address]-2

## 2019-12-30 ENCOUNTER — Ambulatory Visit: Payer: Self-pay | Attending: Internal Medicine

## 2019-12-30 DIAGNOSIS — Z23 Encounter for immunization: Secondary | ICD-10-CM

## 2019-12-30 NOTE — Progress Notes (Signed)
   Covid-19 Vaccination Clinic  Name:  Karla Powers    MRN: [id number] DOB: 1976/03/02  12/30/2019  Karla Powers was observed post Covid-19 immunization for 15 minutes without incident. She was provided with Vaccine Information Sheet and instruction to access the V-Safe system.   Karla Powers was instructed to call 911 with any severe reactions post vaccine: Marland Kitchen Difficulty breathing  . Swelling of face and throat  . A fast heartbeat  . A bad rash all over body  . Dizziness and weakness   Immunizations Administered    Name Date Dose VIS Date Route   Pfizer COVID-19 Vaccine 12/30/2019  5:11 PM 0.3 mL 08/26/2019 Intramuscular   Manufacturer: [employer], [employer]   Lot: [id number]   NDC: [address]-5

## 2020-12-24 IMAGING — DX PORTABLE CHEST - 1 VIEW
1 series · 1 of 1 positions shown · non-contrast
Comparison: None.

CLINICAL DATA: Fever, chills

EXAM:
PORTABLE CHEST 1 VIEW

[chest ap]
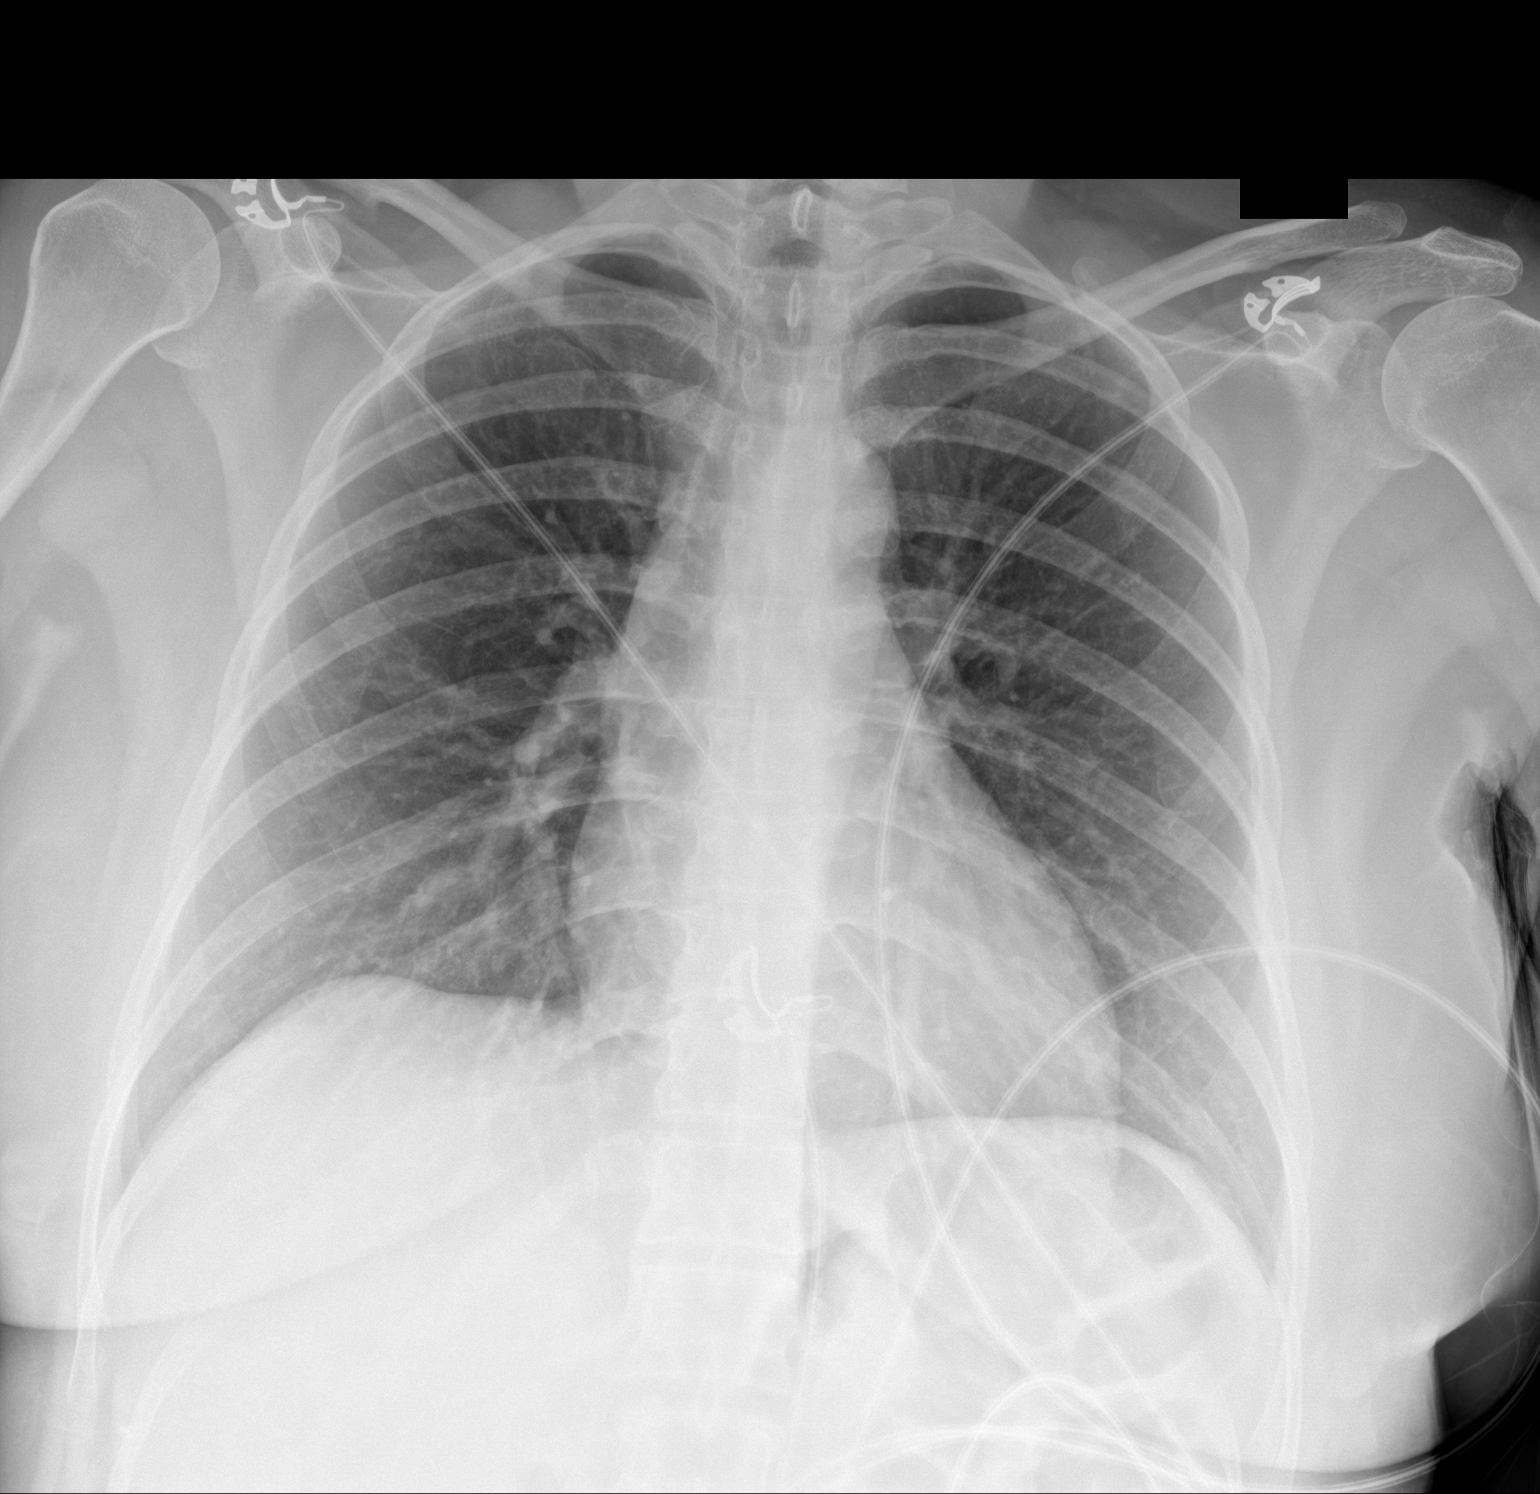

[1 of 1 positions shown; findings below may reference images not displayed]

FINDINGS: Heart and mediastinal contours are within normal limits. No focal
opacities or effusions. No acute bony abnormality.
IMPRESSION: No active disease.
